# Patient Record
Sex: Female | Born: 1955
Health system: Southern US, Community
[De-identification: ages and names within clinical notes are randomized; demographics above are authoritative.]

## PROBLEM LIST (undated history)

## (undated) DIAGNOSIS — Z780 Asymptomatic menopausal state: Secondary | ICD-10-CM

## (undated) DIAGNOSIS — C50919 Malignant neoplasm of unspecified site of unspecified female breast: Secondary | ICD-10-CM

## (undated) DIAGNOSIS — R011 Cardiac murmur, unspecified: Secondary | ICD-10-CM

## (undated) DIAGNOSIS — K529 Noninfective gastroenteritis and colitis, unspecified: Secondary | ICD-10-CM

## (undated) DIAGNOSIS — G43909 Migraine, unspecified, not intractable, without status migrainosus: Secondary | ICD-10-CM

## (undated) DIAGNOSIS — J302 Other seasonal allergic rhinitis: Secondary | ICD-10-CM

## (undated) DIAGNOSIS — K5010 Crohn's disease of large intestine without complications: Secondary | ICD-10-CM

## (undated) DIAGNOSIS — N3010 Interstitial cystitis (chronic) without hematuria: Secondary | ICD-10-CM

## (undated) DIAGNOSIS — C44310 Basal cell carcinoma of skin of unspecified parts of face: Secondary | ICD-10-CM

## (undated) DIAGNOSIS — F419 Anxiety disorder, unspecified: Secondary | ICD-10-CM

## (undated) DIAGNOSIS — N200 Calculus of kidney: Secondary | ICD-10-CM

## (undated) DIAGNOSIS — E785 Hyperlipidemia, unspecified: Secondary | ICD-10-CM

## (undated) DIAGNOSIS — M1990 Unspecified osteoarthritis, unspecified site: Secondary | ICD-10-CM

## (undated) HISTORY — DX: Other seasonal allergic rhinitis: J30.2

## (undated) HISTORY — PX: BREAST LUMPECTOMY: SHX2

## (undated) HISTORY — DX: Basal cell carcinoma of skin of unspecified parts of face: C44.310

## (undated) HISTORY — DX: Migraine, unspecified, not intractable, without status migrainosus: G43.909

## (undated) HISTORY — DX: Cardiac murmur, unspecified: R01.1

## (undated) HISTORY — DX: Anxiety disorder, unspecified: F41.9

## (undated) HISTORY — DX: Malignant neoplasm of unspecified site of unspecified female breast: C50.919

## (undated) HISTORY — PX: TONSILLECTOMY: SUR1361

## (undated) HISTORY — DX: Calculus of kidney: N20.0

## (undated) HISTORY — DX: Crohn's disease of large intestine without complications: K50.10

## (undated) HISTORY — PX: COLONOSCOPY: SHX174

## (undated) HISTORY — DX: Asymptomatic menopausal state: Z78.0

## (undated) HISTORY — DX: Noninfective gastroenteritis and colitis, unspecified: K52.9

## (undated) HISTORY — DX: Unspecified osteoarthritis, unspecified site: M19.90

## (undated) HISTORY — DX: Interstitial cystitis (chronic) without hematuria: N30.10

## (undated) HISTORY — DX: Hyperlipidemia, unspecified: E78.5

---

## 1999-05-21 ENCOUNTER — Other Ambulatory Visit: Admission: RE | Admit: 1999-05-21 | Discharge: 1999-05-21 | Payer: Self-pay | Admitting: Gynecology

## 2000-04-26 ENCOUNTER — Other Ambulatory Visit: Admission: RE | Admit: 2000-04-26 | Discharge: 2000-04-26 | Payer: Self-pay | Admitting: Gynecology

## 2000-07-12 ENCOUNTER — Other Ambulatory Visit: Admission: RE | Admit: 2000-07-12 | Discharge: 2000-07-12 | Payer: Self-pay | Admitting: Gynecology

## 2001-07-17 ENCOUNTER — Other Ambulatory Visit: Admission: RE | Admit: 2001-07-17 | Discharge: 2001-07-17 | Payer: Self-pay | Admitting: Gynecology

## 2002-07-19 ENCOUNTER — Other Ambulatory Visit: Admission: RE | Admit: 2002-07-19 | Discharge: 2002-07-19 | Payer: Self-pay | Admitting: Obstetrics and Gynecology

## 2003-07-03 ENCOUNTER — Other Ambulatory Visit: Admission: RE | Admit: 2003-07-03 | Discharge: 2003-07-03 | Payer: Self-pay | Admitting: Obstetrics & Gynecology

## 2004-08-03 ENCOUNTER — Other Ambulatory Visit: Admission: RE | Admit: 2004-08-03 | Discharge: 2004-08-03 | Payer: Self-pay | Admitting: Gynecology

## 2005-06-07 ENCOUNTER — Ambulatory Visit: Payer: Self-pay | Admitting: Internal Medicine

## 2005-06-18 ENCOUNTER — Ambulatory Visit: Payer: Self-pay | Admitting: Internal Medicine

## 2005-10-04 ENCOUNTER — Other Ambulatory Visit: Admission: RE | Admit: 2005-10-04 | Discharge: 2005-10-04 | Payer: Self-pay | Admitting: Obstetrics & Gynecology

## 2009-07-22 ENCOUNTER — Encounter: Admission: RE | Admit: 2009-07-22 | Discharge: 2009-07-22 | Payer: Self-pay | Admitting: Family Medicine

## 2009-09-30 ENCOUNTER — Encounter: Admission: RE | Admit: 2009-09-30 | Discharge: 2009-09-30 | Payer: Self-pay | Admitting: Family Medicine

## 2012-04-26 ENCOUNTER — Encounter: Payer: Self-pay | Admitting: Internal Medicine

## 2012-04-26 ENCOUNTER — Ambulatory Visit (INDEPENDENT_AMBULATORY_CARE_PROVIDER_SITE_OTHER): Payer: Self-pay | Admitting: Internal Medicine

## 2012-04-26 VITALS — BP 112/76 | HR 71 | Temp 99.2°F | Ht 61.0 in | Wt 130.2 lb

## 2012-04-26 DIAGNOSIS — N951 Menopausal and female climacteric states: Secondary | ICD-10-CM

## 2012-04-26 DIAGNOSIS — G43909 Migraine, unspecified, not intractable, without status migrainosus: Secondary | ICD-10-CM

## 2012-04-26 DIAGNOSIS — Z780 Asymptomatic menopausal state: Secondary | ICD-10-CM

## 2012-04-26 NOTE — Progress Notes (Signed)
  Subjective:    Patient ID: Jenna Li, female    DOB: 09-25-1956, 56 y.o.   MRN: [id number]  HPI New pt here for first visit.  Primary care [facility].  Jenna Li is here for second opinion regarding HT.  She has been on estradiol/norethindrone for about 5 years and she is concerned about risks.  She initially was placed on HT for frequent vasomotor flushes (drenching) and recently tried to take Her pill qod but flushes returned.  She now take 1/2 pill daily.    No personal of Fh of DVT, PE no breast cancer, no gyn cancers.  Father had NI  Excedrin helps her migraine   No Known Allergies Past Medical History  Diagnosis Date  . Menopause   . Migraine headache    Past Surgical History  Procedure Date  . Cesarean section   . Tonsillectomy    History   Social History  . Marital Status: Married    Spouse Name: N/A    Number of Children: N/A  . Years of Education: N/A   Occupational History  . Not on file.   Social History Main Topics  . Smoking status: Never Smoker   . Smokeless tobacco: Never Used  . Alcohol Use: Not on file  . Drug Use: No  . Sexually Active: Yes    Birth Control/ Protection: Post-menopausal   Other Topics Concern  . Not on file   Social History Narrative  . No narrative on file   Family History  Problem Relation Age of Onset  . Atrial fibrillation Mother   . Heart disease Mother   . Heart disease Father   . Hyperlipidemia Father   . Hyperlipidemia Brother   . Hyperlipidemia Brother    There is no problem list on file for this patient.  Current Outpatient Prescriptions on File Prior to Visit  Medication Sig Dispense Refill  . estradiol-norethindrone (MIMVEY) 1-0.5 MG per tablet Take 0.5 tablets by mouth daily.      . eszopiclone (LUNESTA) 2 MG TABS Take 2 mg by mouth at bedtime. Take immediately before bedtime - takes 1/4 tablet as needed      . loratadine (CLARITIN) 10 MG tablet Take 10 mg by mouth daily as needed.             Review of Systems See HPI    Objective:   Physical Exam Physical Exam  Nursing note and vitals reviewed.  Constitutional: She is oriented to person, place, and time. She appears well-developed and well-nourished.  HENT:  Head: Normocephalic and atraumatic.  Cardiovascular: Normal rate and regular rhythm. Exam reveals no gallop and no friction rub.  No murmur heard.  Pulmonary/Chest: Breath sounds normal. She has no wheezes. She has no rales.  Neurological: She is alert and oriented to person, place, and time.  Skin: Skin is warm and dry.  Psychiatric: She has a normal mood and affect. Her behavior is normal.         Assessment & Plan:  1)  Symptomataic vasomotor flushes.  I gave pt educational handout regarding HT and informed that breast cancer risk is related to duration of use.  Advised there are non-hormone options for hot flushes as 50% of women will flush when coming Off HT.  Also advised it may be easier to come off HY in winter as warm temps increase frequency of flushes.  2)  Migraine  Well controlled    Return prn

## 2012-04-27 ENCOUNTER — Encounter: Payer: Self-pay | Admitting: Internal Medicine

## 2012-04-27 DIAGNOSIS — Z780 Asymptomatic menopausal state: Secondary | ICD-10-CM | POA: Insufficient documentation

## 2012-04-27 DIAGNOSIS — G43909 Migraine, unspecified, not intractable, without status migrainosus: Secondary | ICD-10-CM | POA: Insufficient documentation

## 2012-08-25 ENCOUNTER — Other Ambulatory Visit: Payer: Self-pay | Admitting: Family Medicine

## 2012-08-25 ENCOUNTER — Ambulatory Visit (HOSPITAL_COMMUNITY): Payer: BC Managed Care – PPO

## 2012-08-25 DIAGNOSIS — R102 Pelvic and perineal pain: Secondary | ICD-10-CM

## 2012-08-28 ENCOUNTER — Ambulatory Visit
Admission: RE | Admit: 2012-08-28 | Discharge: 2012-08-28 | Disposition: A | Discharge status: Home / Self Care | Payer: BC Managed Care – PPO | Source: Ambulatory Visit | Attending: Family Medicine | Admitting: Family Medicine

## 2012-08-28 DIAGNOSIS — R102 Pelvic and perineal pain: Secondary | ICD-10-CM

## 2012-08-29 ENCOUNTER — Other Ambulatory Visit: Payer: BC Managed Care – PPO

## 2012-10-12 DIAGNOSIS — R3915 Urgency of urination: Secondary | ICD-10-CM

## 2012-10-12 HISTORY — DX: Urgency of urination: R39.15

## 2012-11-23 ENCOUNTER — Other Ambulatory Visit: Payer: Self-pay | Admitting: Family Medicine

## 2012-11-23 DIAGNOSIS — E8941 Symptomatic postprocedural ovarian failure: Secondary | ICD-10-CM

## 2012-11-28 DIAGNOSIS — N3010 Interstitial cystitis (chronic) without hematuria: Secondary | ICD-10-CM

## 2012-11-28 DIAGNOSIS — R350 Frequency of micturition: Secondary | ICD-10-CM

## 2012-11-28 HISTORY — DX: Interstitial cystitis (chronic) without hematuria: N30.10

## 2012-11-28 HISTORY — DX: Frequency of micturition: R35.0

## 2012-11-29 ENCOUNTER — Other Ambulatory Visit: Payer: BC Managed Care – PPO

## 2012-12-06 ENCOUNTER — Ambulatory Visit
Admission: RE | Admit: 2012-12-06 | Discharge: 2012-12-06 | Disposition: A | Discharge status: Home / Self Care | Payer: BC Managed Care – PPO | Source: Ambulatory Visit | Attending: Family Medicine | Admitting: Family Medicine

## 2012-12-06 DIAGNOSIS — E8941 Symptomatic postprocedural ovarian failure: Secondary | ICD-10-CM

## 2013-02-08 DIAGNOSIS — G4700 Insomnia, unspecified: Secondary | ICD-10-CM

## 2013-02-08 HISTORY — DX: Insomnia, unspecified: G47.00

## 2013-02-09 DIAGNOSIS — H11441 Conjunctival cysts, right eye: Secondary | ICD-10-CM

## 2013-02-09 DIAGNOSIS — H40033 Anatomical narrow angle, bilateral: Secondary | ICD-10-CM

## 2013-02-09 HISTORY — DX: Conjunctival cysts, right eye: H11.441

## 2013-02-09 HISTORY — DX: Anatomical narrow angle, bilateral: H40.033

## 2013-02-22 DIAGNOSIS — IMO0001 Reserved for inherently not codable concepts without codable children: Secondary | ICD-10-CM

## 2013-02-22 HISTORY — DX: Reserved for inherently not codable concepts without codable children: IMO0001

## 2013-03-02 DIAGNOSIS — H2513 Age-related nuclear cataract, bilateral: Secondary | ICD-10-CM

## 2013-03-02 HISTORY — DX: Age-related nuclear cataract, bilateral: H25.13

## 2013-08-28 DIAGNOSIS — D259 Leiomyoma of uterus, unspecified: Secondary | ICD-10-CM

## 2013-08-28 HISTORY — DX: Leiomyoma of uterus, unspecified: D25.9

## 2013-10-12 DIAGNOSIS — N200 Calculus of kidney: Secondary | ICD-10-CM | POA: Insufficient documentation

## 2013-10-12 HISTORY — DX: Calculus of kidney: N20.0

## 2014-08-06 ENCOUNTER — Encounter: Payer: Self-pay | Admitting: Gastroenterology

## 2014-08-19 ENCOUNTER — Ambulatory Visit: Payer: BC Managed Care – PPO | Admitting: Gastroenterology

## 2014-08-28 ENCOUNTER — Ambulatory Visit (INDEPENDENT_AMBULATORY_CARE_PROVIDER_SITE_OTHER): Payer: BC Managed Care – PPO | Admitting: Gastroenterology

## 2014-08-28 ENCOUNTER — Encounter: Payer: Self-pay | Admitting: Gastroenterology

## 2014-08-28 VITALS — BP 100/62 | HR 68 | Ht 61.0 in | Wt 133.6 lb

## 2014-08-28 DIAGNOSIS — R194 Change in bowel habit: Secondary | ICD-10-CM

## 2014-08-28 DIAGNOSIS — K529 Noninfective gastroenteritis and colitis, unspecified: Secondary | ICD-10-CM

## 2014-08-28 DIAGNOSIS — K5010 Crohn's disease of large intestine without complications: Secondary | ICD-10-CM

## 2014-08-28 DIAGNOSIS — R198 Other specified symptoms and signs involving the digestive system and abdomen: Secondary | ICD-10-CM

## 2014-08-28 HISTORY — DX: Noninfective gastroenteritis and colitis, unspecified: K52.9

## 2014-08-28 HISTORY — DX: Crohn's disease of large intestine without complications: K50.10

## 2014-08-28 NOTE — Progress Notes (Signed)
08/28/2014 Jenna Li [id number] 1956-08-15   HISTORY OF PRESENT ILLNESS:  This is a pleasant 58 year old female who is known to Dr. Carlean Purl for previous colonoscopy in 05/2005 at which time the study was normal with only external hemorrhoids noted.  She presents to our office today with complaints of change in bowel habits.  Says that she's tended to have a sensitive stomach all of her life, but previously had more issues with constipation.  Since December of last year, however, she's experienced a change and usually has more frequent looser stools with more urgency at times.  She denies any abdominal pain or blood in her stool.  Says that sometimes her abdomen is just uncomfortable and " just doesn't feel quite right".  These issues do come and go and she actually hasn't been experiencing much problem in the past couple of weeks.  She feels that her symptoms are manageable and more just a nuisance at times, but are not controlling her life to the point that she needs to take medication, but more just wants reassurance that everything else is ok.  She had labs by her PCP in June with normal CBC, CMP, and TSH.    Past Medical History  Diagnosis Date  . Menopause   . Migraine headache   . Interstitial cystitis   . Kidney stones   . Basal cell carcinoma of face    Past Surgical History  Procedure Laterality Date  . Cesarean section    . Tonsillectomy      reports that she has never smoked. She has never used smokeless tobacco. She reports that she drinks about 1.2 ounces of alcohol per week. She reports that she does not use illicit drugs. family history includes Atrial fibrillation in her mother; Heart disease in her father; Hyperlipidemia in her brother, brother, and father; Multiple myeloma in her paternal grandmother. There is no history of Colon cancer, Esophageal cancer, Stomach cancer, Kidney disease, Liver disease, or Diabetes. No Known Allergies    Outpatient Encounter  Prescriptions as of 08/28/2014  Medication Sig  . estradiol-norethindrone (MIMVEY) 1-0.5 MG per tablet Take 0.5 tablets by mouth daily.  . eszopiclone (LUNESTA) 2 MG TABS Take 2 mg by mouth at bedtime. Take immediately before bedtime - takes 1/4 tablet as needed  . fluticasone (FLONASE) 50 MCG/ACT nasal spray Place 1 spray into both nostrils as needed for allergies or rhinitis.  Marland Kitchen gabapentin (NEURONTIN) 100 MG capsule Take 100 mg by mouth daily.  . hydrOXYzine (ATARAX/VISTARIL) 25 MG tablet Take 12.5 mg by mouth daily.  . mirabegron ER (MYRBETRIQ) 50 MG TB24 tablet Take 50 mg by mouth daily.  . pentosan polysulfate (ELMIRON) 100 MG capsule Take 200 mg by mouth daily.  . [DISCONTINUED] loratadine (CLARITIN) 10 MG tablet Take 10 mg by mouth daily as needed.     REVIEW OF SYSTEMS  : All other systems reviewed and negative except where noted in the History of Present Illness.   PHYSICAL EXAM: BP 100/62  Pulse 68  Ht $R'5\' 1"'VB$  (1.549 m)  Wt 133 lb 9.6 oz (60.601 kg)  BMI 25.26 kg/m2 General: Well developed white female in no acute distress Head: Normocephalic and atraumatic Eyes:  Sclerae anicteric, conjunctiva pink. Ears: Normal auditory acuity Lungs: Clear throughout to auscultation Heart: Regular rate and rhythm Abdomen: Soft, non-distended.  Normal bowel sounds.  Non-tender. Rectal:  Deferred.  Will be done at the time of colonoscopy. Musculoskeletal: Symmetrical with no gross deformities  Skin: No lesions on visible extremities Extremities: No edema  Neurological: Alert oriented x 4, grossly non-focal Psychological:  Alert and cooperative. Normal mood and affect  ASSESSMENT AND PLAN: -Change in bowel habits with looser more frequent stools:  She likely has IBS, but due to patient concern will schedule colonoscopy for further evaluation.  She says that if she knows that everything is ok then she can just deal with her symptoms at this point, but would like reassurance.  Will also  request recent lab results from PCP.

## 2014-08-28 NOTE — Progress Notes (Signed)
Agree with Ms. Landess's assessment and plan. Maymuna Detzel E. Jaterrius Ricketson, MD, FACG   

## 2014-08-28 NOTE — Patient Instructions (Signed)
You have been scheduled for a colonoscopy. Please follow written instructions given to you at your visit today.  If you use inhalers (even only as needed), please bring them with you on the day of your procedure. Your physician has requested that you go to www.startemmi.com and enter the access code given to you at your visit today. This web site gives a general overview about your procedure. However, you should still follow specific instructions given to you by our office regarding your preparation for the procedure.  

## 2014-09-03 ENCOUNTER — Encounter: Payer: Self-pay | Admitting: Internal Medicine

## 2014-10-21 ENCOUNTER — Encounter: Payer: Self-pay | Admitting: Gastroenterology

## 2014-10-23 ENCOUNTER — Encounter: Payer: BC Managed Care – PPO | Admitting: Internal Medicine

## 2014-11-13 ENCOUNTER — Encounter: Payer: BC Managed Care – PPO | Admitting: Internal Medicine

## 2014-12-20 DIAGNOSIS — C50919 Malignant neoplasm of unspecified site of unspecified female breast: Secondary | ICD-10-CM

## 2014-12-20 HISTORY — DX: Malignant neoplasm of unspecified site of unspecified female breast: C50.919

## 2014-12-31 ENCOUNTER — Encounter: Payer: BC Managed Care – PPO | Admitting: Internal Medicine

## 2015-01-14 ENCOUNTER — Encounter: Payer: BC Managed Care – PPO | Admitting: Internal Medicine

## 2015-04-15 ENCOUNTER — Encounter: Payer: Self-pay | Admitting: Internal Medicine

## 2015-06-18 ENCOUNTER — Ambulatory Visit (AMBULATORY_SURGERY_CENTER): Payer: Self-pay

## 2015-06-18 VITALS — Ht 62.0 in | Wt 141.4 lb

## 2015-06-18 DIAGNOSIS — Z1211 Encounter for screening for malignant neoplasm of colon: Secondary | ICD-10-CM

## 2015-06-18 NOTE — Progress Notes (Signed)
No allergies to eggs or soy No diet/weight loss meds No home oxygen No past problems with anesthesia  Has email  Emmi instructions given for colonoscopy 

## 2015-06-19 ENCOUNTER — Encounter: Payer: Self-pay | Admitting: Internal Medicine

## 2015-06-22 DIAGNOSIS — C50911 Malignant neoplasm of unspecified site of right female breast: Secondary | ICD-10-CM

## 2015-06-22 HISTORY — DX: Malignant neoplasm of unspecified site of right female breast: C50.911

## 2015-07-09 ENCOUNTER — Encounter: Payer: BC Managed Care – PPO | Admitting: Internal Medicine

## 2015-07-11 ENCOUNTER — Ambulatory Visit (AMBULATORY_SURGERY_CENTER): Payer: BC Managed Care – PPO | Admitting: Internal Medicine

## 2015-07-11 ENCOUNTER — Encounter: Payer: Self-pay | Admitting: Internal Medicine

## 2015-07-11 VITALS — BP 104/68 | HR 78 | Temp 97.8°F | Resp 20 | Ht 62.0 in | Wt 141.0 lb

## 2015-07-11 DIAGNOSIS — K633 Ulcer of intestine: Secondary | ICD-10-CM | POA: Diagnosis not present

## 2015-07-11 DIAGNOSIS — K529 Noninfective gastroenteritis and colitis, unspecified: Secondary | ICD-10-CM | POA: Diagnosis not present

## 2015-07-11 DIAGNOSIS — Z1211 Encounter for screening for malignant neoplasm of colon: Secondary | ICD-10-CM

## 2015-07-11 MED ORDER — SODIUM CHLORIDE 0.9 % IV SOLN: 500.0000 mL | INTRAVENOUS | Status: DC

## 2015-07-11 NOTE — Op Note (Signed)
[address] [place], [id number]   COLONOSCOPY PROCEDURE REPORT  PATIENT: Jenna Li, Jenna Li  MR#: [id number] BIRTHDATE: 1956-04-06 , 70  yrs. old GENDER: female ENDOSCOPIST: Gatha Mayer, MD, [facility] PROCEDURE DATE:  07/11/2015 PROCEDURE:   Colonoscopy, screening and Colonoscopy with biopsy First Screening Colonoscopy - Avg.  risk and is 50 yrs.  old or older - No.  Prior Negative Screening - Now for repeat screening. 10 or more years since last screening  History of Adenoma - Now for follow-up colonoscopy & has been > or = to 3 yrs.  N/A  Polyps removed today? No Recommend repeat exam, <10 yrs? No ASA CLASS:   Class II INDICATIONS:Screening for colonic neoplasia and Colorectal Neoplasm Risk Assessment for this procedure is average risk. MEDICATIONS: Propofol 250 mg IV and Monitored anesthesia care  DESCRIPTION OF PROCEDURE:   After the risks benefits and alternatives of the procedure were thoroughly explained, informed consent was obtained.  The digital rectal exam revealed no abnormalities of the rectum.   The LB [id number]  endoscope was introduced through the anus and advanced to the terminal ileum which was intubated for a short distance. No adverse events experienced.   The quality of the prep was excellent.  (MiraLax was used)  The instrument was then slowly withdrawn as the colon was fully examined. Estimated blood loss is zero unless otherwise noted in this procedure report.    COLON FINDINGS: The examined terminal ileum appeared to be normal. Non-bleeding mucosal ulceration, intermittent across the area examined, was present at the cecum.  Multiple biopsies of the area were performed using cold forceps.   Small external and internal hemorrhoids were found.   The examination was otherwise normal. Retroflexed views revealed internal hemorrhoids and Retroflexed views revealed external hemorrhoids. The time to cecum = 3.3 Withdrawal  time = 11.4   The scope was withdrawn and the procedure completed. COMPLICATIONS: There were no immediate complications.  ENDOSCOPIC IMPRESSION: 1.   The examined terminal ileum appeared to be normal 2.   Non-bleeding mucosal ulceration at the cecum; multiple biopsies of the area were performed using cold forceps ? IBD 3.   Small external and internal hemorrhoids 4.   The examination was otherwise normal  RECOMMENDATIONS: Office will call with the results.  eSigned:  Gatha Mayer, MD, [facility] 07/11/2015 9:37 AM   cc: Dr. Harlan Stains and The Patient

## 2015-07-11 NOTE — Patient Instructions (Addendum)
No signs of polyps or cancer but I saw  small ulcers with inflammatory changes in the cecum or beginning of the colon. I took biopsies and will let you know the results with a phone call. This could be related to the looser bowel movements you were describing in 2015.  You also have inflamed hemorrhoids after the prep. Please try some preparation H cream or suppositories if needed.  I appreciate the opportunity to care for you. Gatha Mayer, MD, FACG  YOU HAD AN ENDOSCOPIC PROCEDURE TODAY AT [facility]:   Refer to the procedure report that was given to you for any specific questions about what was found during the examination.  If the procedure report does not answer your questions, please call your gastroenterologist to clarify.  If you requested that your care partner not be given the details of your procedure findings, then the procedure report has been included in a sealed envelope for you to review at your convenience later.  YOU SHOULD EXPECT: Some feelings of bloating in the abdomen. Passage of more gas than usual.  Walking can help get rid of the air that was put into your GI tract during the procedure and reduce the bloating. If you had a lower endoscopy (such as a colonoscopy or flexible sigmoidoscopy) you may notice spotting of blood in your stool or on the toilet paper. If you underwent a bowel prep for your procedure, you may not have a normal bowel movement for a few days.  Please Note:  You might notice some irritation and congestion in your nose or some drainage.  This is from the oxygen used during your procedure.  There is no need for concern and it should clear up in a day or so.  SYMPTOMS TO REPORT IMMEDIATELY:   Following lower endoscopy (colonoscopy or flexible sigmoidoscopy):  Excessive amounts of blood in the stool  Significant tenderness or worsening of abdominal pains  Swelling of the abdomen that is new, acute  Fever of 100F or  higher   Following upper endoscopy (EGD)  Vomiting of blood or coffee ground material  New chest pain or pain under the shoulder blades  Painful or persistently difficult swallowing  New shortness of breath  Fever of 100F or higher  Black, tarry-looking stools  For urgent or emergent issues, a gastroenterologist can be reached at any hour by calling [phone].   DIET: Your first meal following the procedure should be a small meal and then it is ok to progress to your normal diet. Heavy or fried foods are harder to digest and may make you feel nauseous or bloated.  Likewise, meals heavy in dairy and vegetables can increase bloating.  Drink plenty of fluids but you should avoid alcoholic beverages for 24 hours.  ACTIVITY:  You should plan to take it easy for the rest of today and you should NOT DRIVE or use heavy machinery until tomorrow (because of the sedation medicines used during the test).    FOLLOW UP: Our staff will call the number listed on your records the next business day following your procedure to check on you and address any questions or concerns that you may have regarding the information given to you following your procedure. If we do not reach you, we will leave a message.  However, if you are feeling well and you are not experiencing any problems, there is no need to return our call.  We will assume that you have returned  to your regular daily activities without incident.  If any biopsies were taken you will be contacted by phone or by letter within the next 1-3 weeks.  Please call us at [phone] if you have not heard about the biopsies in 3 weeks.    SIGNATURES/CONFIDENTIALITY: You and/or your care partner have signed paperwork which will be entered into your electronic medical record.  These signatures attest to the fact that that the information above on your After Visit Summary has been reviewed and is understood.  Full responsibility of the confidentiality of  this discharge information lies with you and/or your care-partner.  Hemorrhoid information given.

## 2015-07-11 NOTE — Progress Notes (Signed)
To recovery, report to Scott, RN, VSS 

## 2015-07-11 NOTE — Progress Notes (Signed)
Called to room to assist during endoscopic procedure.  Patient ID and intended procedure confirmed with present staff. Received instructions for my participation in the procedure from the performing physician.  

## 2015-07-14 ENCOUNTER — Telehealth: Payer: Self-pay | Admitting: *Deleted

## 2015-07-14 NOTE — Telephone Encounter (Signed)
  Follow up Call-  Call back number 07/11/2015  Post procedure Call Back phone  # [phone]  Permission to leave phone message Yes     Patient questions:  Do you have a fever, pain , or abdominal swelling? No. Pain Score  0 *  Have you tolerated food without any problems? Yes.    Have you been able to return to your normal activities? Yes.    Do you have any questions about your discharge instructions: Diet   No. Medications  No. Follow up visit  No.  Do you have questions or concerns about your Care? No.  Actions: * If pain score is 4 or above: No action needed, pain <4.  Spoke with spouse, he denies any problems or concerns. Patient is out of town.

## 2015-07-16 ENCOUNTER — Encounter: Payer: Self-pay | Admitting: Internal Medicine

## 2015-07-16 NOTE — Progress Notes (Signed)
Quick Note:  Please let her know that bxs show colitis - I think she has IBD, likely Crohn's colitis  Rx Lialda 1.2 g take 2 each day and see me in 2 months Let her know that if loose stools or diarrhea worsen on Lialda it is a rare side effect and she should stop it and let us know  [facility] - no letter  Colon recall 06/2025 ______

## 2015-07-18 ENCOUNTER — Other Ambulatory Visit: Payer: Self-pay

## 2015-07-18 MED ORDER — MESALAMINE 1.2 G PO TBEC: 2.4000 g | DELAYED_RELEASE_TABLET | Freq: Every day | ORAL | Status: DC

## 2015-09-18 ENCOUNTER — Ambulatory Visit: Payer: BC Managed Care – PPO | Admitting: Internal Medicine

## 2015-12-23 ENCOUNTER — Encounter: Payer: Self-pay | Admitting: Internal Medicine

## 2015-12-23 ENCOUNTER — Ambulatory Visit (INDEPENDENT_AMBULATORY_CARE_PROVIDER_SITE_OTHER): Payer: BC Managed Care – PPO | Admitting: Internal Medicine

## 2015-12-23 VITALS — BP 96/64 | HR 96 | Ht 60.0 in | Wt 141.4 lb

## 2015-12-23 DIAGNOSIS — K529 Noninfective gastroenteritis and colitis, unspecified: Secondary | ICD-10-CM

## 2015-12-23 NOTE — Patient Instructions (Signed)
Please follow up with Dr. Carlean Purl in one year, sooner if needed

## 2015-12-23 NOTE — Progress Notes (Signed)
   Subjective:    Patient ID: Jenna Li, female    DOB: [birth year], 60 y.o.   MRN: [id number] Chief complaint: Follow-up of colitis HPI The patient returns after having had a colonoscopy in the summer, where she had some ulceration, aphthous type, in the right colon and biopsy showed acute colitis. She was having loose stools at the time and I thought she might have Crohn's disease, I recommended Lialda. She was being diagnosed and treated for breast cancer right around that time and felt overwhelmed and did not start the [place]. Since that time she has really been fine. She is not having any diarrhea abdominal pain or rectal bleeding. She had a lumpectomy for her 4 mm breast cancer and is on anastrozole after a short course of radiation therapy. Medications, allergies, past medical history, past surgical history, family history and social history are reviewed and updated in the EMR.  Review of Systems As above    Objective:   Physical Exam BP 96/64 mmHg  Pulse 96  Ht 5' (1.524 m)  Wt 141 lb 6 oz (64.127 kg)  BMI 27.61 kg/m2    Assessment & Plan:   1. Colitis, acute nonspecific at colonoscopy    Colitis - acute nonspecific at colonoscopy I thought she could have Crohn's disease. We reviewed this today and she is doing well, I am not inclined to start therapy. She never started it, and she feels normal at this point. The biopsies did not show chronic colitis so perhaps this is not IBD. It could've been aphthous ulceration related to the colonoscopy prep perhaps. At any rate she feels well there was not severe or much inflammation in the colon anyway and we will observe and I will see her in 1 year routinely sooner if needed.   15 minutes time spent with patient > half in counseling coordination of care

## 2015-12-23 NOTE — Assessment & Plan Note (Signed)
I thought she could have Crohn's disease. We reviewed this today and she is doing well, I am not inclined to start therapy. She never started it, and she feels normal at this point. The biopsies did not show chronic colitis so perhaps this is not IBD. It could've been aphthous ulceration related to the colonoscopy prep perhaps. At any rate she feels well there was not severe or much inflammation in the colon anyway and we will observe and I will see her in 1 year routinely sooner if needed.

## 2016-10-27 ENCOUNTER — Ambulatory Visit
Admission: RE | Admit: 2016-10-27 | Discharge: 2016-10-27 | Disposition: A | Discharge status: Home / Self Care | Payer: BC Managed Care – PPO | Source: Ambulatory Visit | Attending: Family Medicine | Admitting: Family Medicine

## 2016-10-27 ENCOUNTER — Other Ambulatory Visit: Payer: Self-pay | Admitting: Family Medicine

## 2016-10-27 DIAGNOSIS — M5441 Lumbago with sciatica, right side: Secondary | ICD-10-CM

## 2016-12-16 ENCOUNTER — Other Ambulatory Visit: Payer: Self-pay | Admitting: Family Medicine

## 2016-12-16 DIAGNOSIS — M5441 Lumbago with sciatica, right side: Secondary | ICD-10-CM

## 2016-12-28 ENCOUNTER — Ambulatory Visit
Admission: RE | Admit: 2016-12-28 | Discharge: 2016-12-28 | Disposition: A | Discharge status: Home / Self Care | Payer: BC Managed Care – PPO | Source: Ambulatory Visit | Attending: Family Medicine | Admitting: Family Medicine

## 2016-12-28 DIAGNOSIS — M5441 Lumbago with sciatica, right side: Secondary | ICD-10-CM

## 2017-07-06 ENCOUNTER — Ambulatory Visit
Admission: RE | Admit: 2017-07-06 | Discharge: 2017-07-06 | Disposition: A | Discharge status: Home / Self Care | Payer: BC Managed Care – PPO | Source: Ambulatory Visit | Attending: Family Medicine | Admitting: Family Medicine

## 2017-07-06 ENCOUNTER — Other Ambulatory Visit: Payer: Self-pay | Admitting: Family Medicine

## 2017-07-06 DIAGNOSIS — R079 Chest pain, unspecified: Secondary | ICD-10-CM

## 2017-09-22 DIAGNOSIS — M810 Age-related osteoporosis without current pathological fracture: Secondary | ICD-10-CM

## 2017-09-22 HISTORY — DX: Age-related osteoporosis without current pathological fracture: M81.0

## 2018-11-02 ENCOUNTER — Encounter: Payer: Self-pay | Admitting: Cardiology

## 2018-11-02 ENCOUNTER — Telehealth: Payer: Self-pay

## 2018-11-02 NOTE — Telephone Encounter (Signed)
SENT REFERRAL TO SCHEDULING AND FILED NOTES 

## 2018-11-28 NOTE — Progress Notes (Signed)
Cardiology Office Note:    Date:  11/29/2018   ID:  Jenna Li, [place] November 16, 1956, MRN [id number]  PCP:  Harlan Stains, MD  Cardiologist:  Shirlee More, MD   Referring MD: Caren Macadam, MD  ASSESSMENT:    1. Tachycardia   2. Encounter for cardiac risk counseling   3. Palpitation   4. Other specified personal risk factors, not elsewhere classified    PLAN:    In order of problems listed above:  1. Her tachycardia was sinus not a sustained atrial arrhythmia and is quite improved after taking a beta-blocker for rapid heart rate and migraine we discussed withdrawal but she would like to stay on it because of the migraine prophylaxis and I think that is alleviated her symptoms.  At this time I do not think there is value in doing cardiac imaging or ambulatory heart rhythm monitor was unless she was having recurrent symptoms.  I did give her information about buying attachment for her iPhone and I watched record her heart rhythm.  At this time her palpitation is not severe sustained if recurrent we have avenues to do heart rhythm surveillance. 2. Her second question is her general cardiovascular risk is a survivor of breast Li and whether she should take a statin.  She is not particularly at high risk without stroke heart attack diabetes or any LDL greater than 190 but she is at increased risk with her breast Li in order to come to a decision to undergo cardiac CT score of 0 there is absolutely no benefit of taking a statin and if she is intermediate or high risk would benefit from long-term statin therapy and will use this to make a decision.  She is comfortable with that approach and strongly encouraged to continue healthy lifestyle and exercise  Next appointment as needed   Medication Adjustments/Labs and Tests Ordered: Current medicines are reviewed at length with the patient today.  Concerns regarding medicines are outlined above.  Orders Placed This Encounter  Procedures   . CT CARDIAC SCORING   No orders of the defined types were placed in this encounter.    Chief Complaint  Patient presents with  . Palpitations    History of Present Illness:    Jenna Li is a 62 y.o. female withStage 1 Right breast Li  who is being seen today for the evaluation of palpitation at the request of Caren Macadam, MD. She is very health conscious because of breast Li with surgery right breast radiation and hormone receptor blockade.  She also is taking medication for urinary incontinence that can cause accelerated heart rate and blood pressure.  She relates she bought an apple watch several months ago notices that her heart rates run in size 90-100 at rest and was concerned.  She has a history of migraine was placed on propranolol not a really nice response with a resting heart rate decreasing and a migraine headache resolving.  She has a lifelong history of episodes where she feels her heart is rapid but will terminate with deep breathing and she has had none recently.  She has no known heart disease but is concerned about her cardiovascular risk and questions whether she needs to take a statin.  No chest pain shortness of breath exercise intolerance syncope or TIA no history of congenital or rheumatic heart disease.  She is having no side effects from her beta-blocker  Past Medical History:  Diagnosis Date  . Basal cell carcinoma of face   .  Breast Li ([place])   . Colitis - acute nonspecific at colonoscopy 08/28/2014  . Crohn's colitis - suspected 08/28/2014  . Heart murmur   . Interstitial cystitis   . Kidney stones   . Menopause   . Migraine headache   . Seasonal allergies     Past Surgical History:  Procedure Laterality Date  . BREAST LUMPECTOMY    . CESAREAN SECTION    . COLONOSCOPY    . TONSILLECTOMY      Current Medications: Current Meds  Medication Sig  . anastrozole (ARIMIDEX) 1 MG tablet Take 1 tablet by mouth daily.  . eszopiclone  (LUNESTA) 2 MG TABS Take 2 mg by mouth at bedtime. Take immediately before bedtime - takes 1/2 tablet as needed  . fluticasone (FLONASE) 50 MCG/ACT nasal spray Place 1 spray into both nostrils as needed for allergies or rhinitis.  Marland Kitchen mirabegron ER (MYRBETRIQ) 50 MG TB24 tablet Take 50 mg by mouth daily.  . pentosan polysulfate (ELMIRON) 100 MG capsule Take 200 mg by mouth daily.  . propranolol ER (INDERAL LA) 60 MG 24 hr capsule Take 60 mg by mouth daily.  . SUMAtriptan (IMITREX) 100 MG tablet TAKE 1 TAB AT ONSET OF HEADACHE, REPEAT IN 2 HOURS X 1 IF NEEDED ORALLY     Allergies:   Patient has no known allergies.   Social History   Socioeconomic History  . Marital status: Married    Spouse name: Not on file  . Number of children: Not on file  . Years of education: Not on file  . Highest education level: Not on file  Occupational History  . Not on file  Social Needs  . Financial resource strain: Not on file  . Food insecurity:    Worry: Not on file    Inability: Not on file  . Transportation needs:    Medical: Not on file    Non-medical: Not on file  Tobacco Use  . Smoking status: Never Smoker  . Smokeless tobacco: Never Used  Substance and Sexual Activity  . Alcohol use: Yes    Alcohol/week: 1.0 standard drinks    Types: 1 Glasses of wine per week    Comment: 1 glass of wine on weekends  . Drug use: No  . Sexual activity: Yes    Birth control/protection: Post-menopausal  Lifestyle  . Physical activity:    Days per week: Not on file    Minutes per session: Not on file  . Stress: Not on file  Relationships  . Social connections:    Talks on phone: Not on file    Gets together: Not on file    Attends religious service: Not on file    Active member of club or organization: Not on file    Attends meetings of clubs or organizations: Not on file    Relationship status: Not on file  Other Topics Concern  . Not on file  Social History Narrative  . Not on file     Family  History: The patient's family history includes Atrial fibrillation in her mother; Heart disease in her father; Hyperlipidemia in her brother, brother, and father; Multiple myeloma in her paternal grandmother. There is no history of Colon Li, Esophageal Li, Stomach Li, Kidney disease, Liver disease, or Diabetes.  ROS:   Review of Systems  Constitution: Positive for malaise/fatigue.  HENT: Negative.   Eyes: Negative.   Cardiovascular: Positive for palpitations.  Respiratory: Negative.   Endocrine: Negative.   Hematologic/Lymphatic: Negative.  Skin: Negative.   Musculoskeletal: Negative.   Gastrointestinal: Negative.   Genitourinary: Negative.   Neurological: Negative.   Psychiatric/Behavioral: Negative.   Allergic/Immunologic: Negative.    Please see the history of present illness.     All other systems reviewed and are negative.  EKGs/Labs/Other Studies Reviewed:    The following studies were reviewed today:   EKG:  EKG is  ordered today.  The ekg ordered today demonstrates sinus rhythm 72 bpm  Recent Labs:   10/26/18 CMP normal K 4.1 CBC normal No results found for requested labs within last 8760 hours.  Recent Lipid Panel No results found for: CHOL, TRIG, HDL, CHOLHDL, VLDL, LDLCALC, LDLDIRECT  Physical Exam:    VS:  BP 110/80 (BP Location: Left Arm, Patient Position: Sitting, Cuff Size: Normal)   Pulse 72   Ht _0  (1.575 m)   Wt 141 lb 6.4 oz (64.1 kg)   SpO2 96%   BMI 25.86 kg/m     Wt Readings from Last 3 Encounters:  11/29/18 141 lb 6.4 oz (64.1 kg)  12/23/15 141 lb 6 oz (64.1 kg)  07/11/15 141 lb (64 kg)     GEN:  Well nourished, well developed in no acute distress HEENT: Normal NECK: No JVD; No carotid bruits LYMPHATICS: No lymphadenopathy CARDIAC: RRR, no murmurs, rubs, gallops RESPIRATORY:  Clear to auscultation without rales, wheezing or rhonchi  ABDOMEN: Soft, non-tender, non-distended MUSCULOSKELETAL:  No edema; No deformity  SKIN:  Warm and dry NEUROLOGIC:  Alert and oriented x 3 PSYCHIATRIC:  Normal affect     Signed, Shirlee More, MD  11/29/2018 3:54 PM    [facility]

## 2018-11-29 ENCOUNTER — Ambulatory Visit: Payer: BC Managed Care – PPO | Admitting: Cardiology

## 2018-11-29 ENCOUNTER — Encounter: Payer: Self-pay | Admitting: Cardiology

## 2018-11-29 VITALS — BP 110/80 | HR 72 | Ht 62.0 in | Wt 141.4 lb

## 2018-11-29 DIAGNOSIS — R000 Tachycardia, unspecified: Secondary | ICD-10-CM

## 2018-11-29 DIAGNOSIS — Z7189 Other specified counseling: Secondary | ICD-10-CM

## 2018-11-29 DIAGNOSIS — R002 Palpitations: Secondary | ICD-10-CM

## 2018-11-29 DIAGNOSIS — Z9189 Other specified personal risk factors, not elsewhere classified: Secondary | ICD-10-CM

## 2018-11-29 HISTORY — DX: Tachycardia, unspecified: R00.0

## 2018-11-29 HISTORY — DX: Palpitations: R00.2

## 2018-11-29 HISTORY — DX: Other specified counseling: Z71.89

## 2018-11-29 NOTE — Patient Instructions (Addendum)
Medication Instructions:  Your physician recommends that you continue on your current medications as directed. Please refer to the Current Medication list given to you today.  If you need a refill on your cardiac medications before your next appointment, please call your pharmacy.   Lab work: None  If you have labs (blood work) drawn today and your tests are completely normal, you will receive your results only by: Marland Kitchen MyChart Message (if you have MyChart) OR . A paper copy in the mail If you have any lab test that is abnormal or we need to change your treatment, we will call you to review the results.  Testing/Procedures: You will be contact to schedule your cardiac calcium scoring at the [facility] in [place].   Follow-Up: At [facility], you and your health needs are our priority.  As part of our continuing mission to provide you with exceptional heart care, we have created designated Provider Care Teams.  These Care Teams include your primary Cardiologist (physician) and Advanced Practice Providers (APPs -  Physician Assistants and Nurse Practitioners) who all work together to provide you with the care you need, when you need it. You will need a follow up appointment as needed if symptoms worsen or fail to improve.       KardiaMobile [email or web address]        FDA-cleared, clinical grade mobile EKG monitor: Jenna Li is the most clinically-validated mobile EKG used by the world's leading cardiac care medical professionals With Basic service, know instantly if your heart rhythm is normal or if atrial fibrillation is detected, and email the last single EKG recording to yourself or your doctor Premium service, available for purchase through the Kardia app for $9.99 per month or $99 per year, includes unlimited history and storage of your EKG recordings, a monthly EKG summary report to share with your doctor, along with the ability to track your  blood pressure, activity and weight Includes one KardiaMobile phone clip FREE SHIPPING: Standard delivery 1-3 business days. Orders placed by 11:00am PST will ship that afternoon. Otherwise, will ship next business day. All orders ship via [employer] from [place], [place], the new wearable EKG by [employer]. Vladimir Faster replaces your original Apple Watch band. The first of its kind, FDA-cleared KardiaBand provides accurate and instant analysis for detecting atrial fibrillation (AF) and normal sinus rhythm in an EKG. Simply place your thumb on the integrated KardiaBand sensor to take a medical-grade EKG in just 30 seconds. Results appear instantly on your Apple Watch. Vladimir Faster is available today for just $199. KardiaBand features are designed exclusively for use with [employer]  - $99 year. The [employer] for [employer] includes [employer]'s revolutionary SmartRhythm monitoring feature. SmartRhythm monitoring uses an intelligent neural network that runs directly on the [employer], constantly acquiring data from the watch's heart rate sensor and its accelerometer. SmartRhythm compares your heart rate to what it expects from your minute-by-minute level of activity. When the network sees a pattern of heart rate and activity that it does not expect, it notifies you to take an EKG. With [employer], peace of mind is just an EKG away.  .        Coronary Calcium Scan A coronary calcium scan is an imaging test used to look for deposits of calcium and other fatty materials (plaques) in the inner lining of the blood vessels of the heart (coronary arteries). These deposits of calcium and plaques  can partly clog and narrow the coronary arteries without producing any symptoms or warning signs. This puts a person at risk for a heart attack. This test can detect these deposits before symptoms develop. Tell a health care provider about:  Any allergies you  have.  All medicines you are taking, including vitamins, herbs, eye drops, creams, and over-the-counter medicines.  Any problems you or family members have had with anesthetic medicines.  Any blood disorders you have.  Any surgeries you have had.  Any medical conditions you have.  Whether you are pregnant or may be pregnant. What are the risks? Generally, this is a safe procedure. However, problems may occur, including:  Harm to a pregnant woman and her unborn baby. This test involves the use of radiation. Radiation exposure can be dangerous to a pregnant woman and her unborn baby. If you are pregnant, you generally should not have this procedure done.  Slight increase in the risk of cancer. This is because of the radiation involved in the test.  What happens before the procedure? No preparation is needed for this procedure. What happens during the procedure?  You will undress and remove any jewelry around your neck or chest.  You will put on a hospital gown.  Sticky electrodes will be placed on your chest. The electrodes will be connected to an electrocardiogram (ECG) machine to record a tracing of the electrical activity of your heart.  A CT scanner will take pictures of your heart. During this time, you will be asked to lie still and hold your breath for 2-3 seconds while a picture of your heart is being taken. The procedure may vary among health care providers and hospitals. What happens after the procedure?  You can get dressed.  You can return to your normal activities.  It is up to you to get the results of your test. Ask your health care provider, or the department that is doing the test, when your results will be ready. Summary  A coronary calcium scan is an imaging test used to look for deposits of calcium and other fatty materials (plaques) in the inner lining of the blood vessels of the heart (coronary arteries).  Generally, this is a safe procedure. Tell your  health care provider if you are pregnant or may be pregnant.  No preparation is needed for this procedure.  A CT scanner will take pictures of your heart.  You can return to your normal activities after the scan is done. This information is not intended to replace advice given to you by your health care provider. Make sure you discuss any questions you have with your health care provider. Document Released: 06/03/2008 Document Revised: 10/25/2016 Document Reviewed: 10/25/2016 Elsevier Interactive Patient Education  2017 [employer].

## 2018-11-29 NOTE — Addendum Note (Signed)
Addended by: Stevan Born on: 11/29/2018 04:38 PM   Modules accepted: Orders

## 2018-12-22 ENCOUNTER — Ambulatory Visit (INDEPENDENT_AMBULATORY_CARE_PROVIDER_SITE_OTHER)
Admission: RE | Admit: 2018-12-22 | Discharge: 2018-12-22 | Disposition: A | Discharge status: Home / Self Care | Payer: Self-pay | Source: Ambulatory Visit | Attending: Cardiology | Admitting: Cardiology

## 2018-12-22 DIAGNOSIS — Z9189 Other specified personal risk factors, not elsewhere classified: Secondary | ICD-10-CM

## 2018-12-26 ENCOUNTER — Telehealth: Payer: Self-pay

## 2018-12-26 MED ORDER — PRAVASTATIN SODIUM 20 MG PO TABS: 20.0000 mg | ORAL_TABLET | Freq: Every evening | ORAL | 5 refills | Status: DC

## 2018-12-26 NOTE — Telephone Encounter (Signed)
-----   Message from Richardo Priest, MD sent at 12/25/2018  1:01 PM EST ----- Normal or stable result  Her score is low risk 0 200, quite low but not 0 and I would still have her start taking a low dose of a low intensity statin pravastatin 20 mg daily dispense 30 refill 3 if she is agreeable 1 p.o. daily

## 2018-12-26 NOTE — Telephone Encounter (Signed)
Patient notified of results per Dr Bettina Gavia.  Patient advised that Dr Bettina Gavia would like her to start pravastatin 20mg  one tablet daily.  Patient agreed to plan and will also discuss with primary care physician later this week.   Rx sent to pharmacy as requested.  Recall added for 6 months per patients request.

## 2019-05-20 DIAGNOSIS — N951 Menopausal and female climacteric states: Secondary | ICD-10-CM | POA: Insufficient documentation

## 2019-05-20 HISTORY — DX: Menopausal and female climacteric states: N95.1

## 2019-06-17 ENCOUNTER — Other Ambulatory Visit: Payer: Self-pay | Admitting: Cardiology

## 2019-09-09 IMAGING — CT CT HEART SCORING
2 series · 16 of 20 positions shown, 18 images · non-contrast
Comparison: None.

Addendum:
EXAM:
OVER-READ INTERPRETATION  CT CHEST

The following report is an over-read performed by radiologist Dr.
Jeannesse Seguna Flask [REDACTED] on 12/22/2018. This
over-read does not include interpretation of cardiac or coronary
anatomy or pathology. The coronary calcium score interpretation by
the cardiologist is attached.
CLINICAL DATA: 62F with prior breast cancer for risk stratification
Coronary Calcium Score
TECHNIQUE: The patient was scanned on a Siemens Force scanner. Axial
non-contrast 3 mm slices were carried out through the heart. The
data set was analyzed on a dedicated work station and scored using
the Agatson method.

[Series 3: casc 3.0 i36f 2 bestdiast 70 % · axial · 0.30mm/px · z∈[+1294,+1387]mm · 8 of 41 slices shown, 10 images]
[im 5/41  vessel]
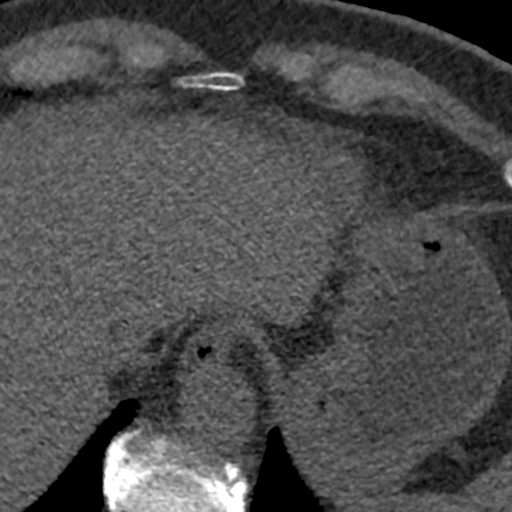
[im 5/41  lung]
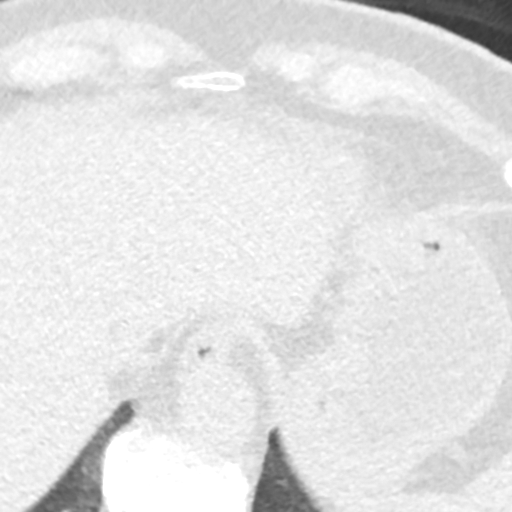
[im 9/41  vessel]
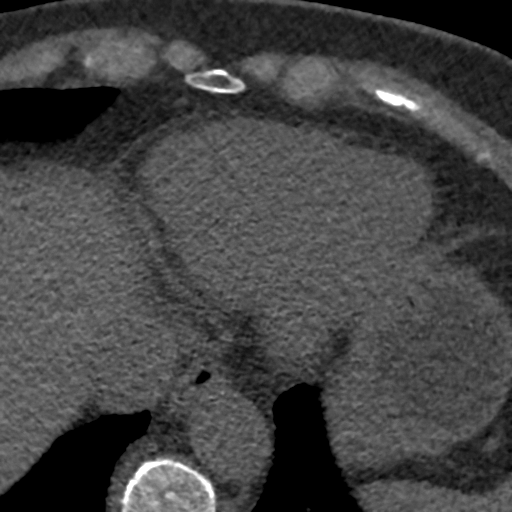
[im 14/41  vessel]
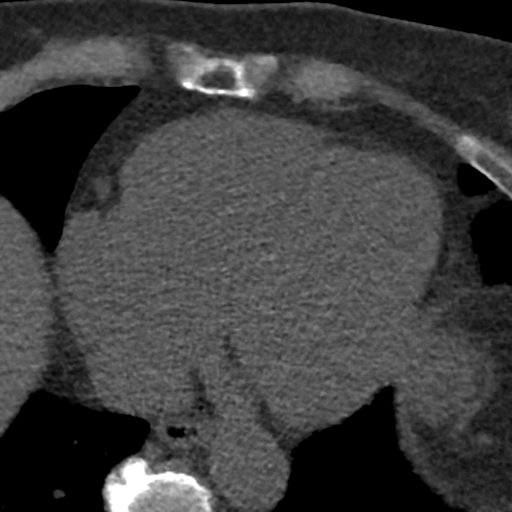
[im 18/41  vessel]
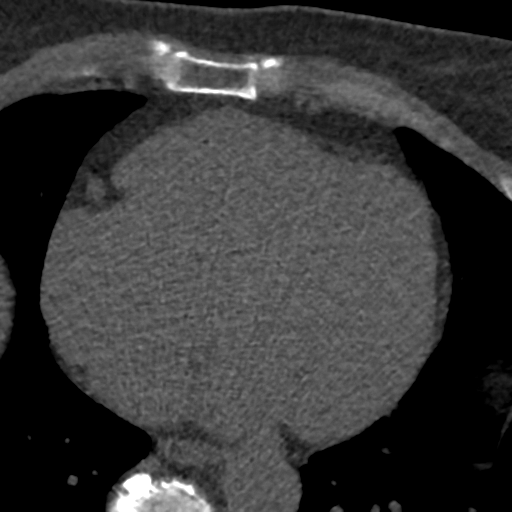
[im 23/41  vessel]
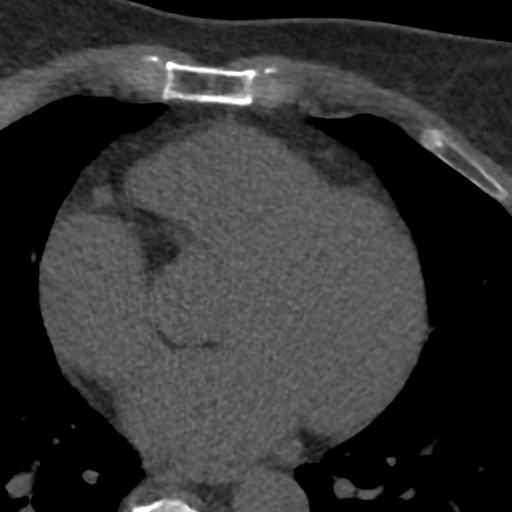
[im 23/41  lung]
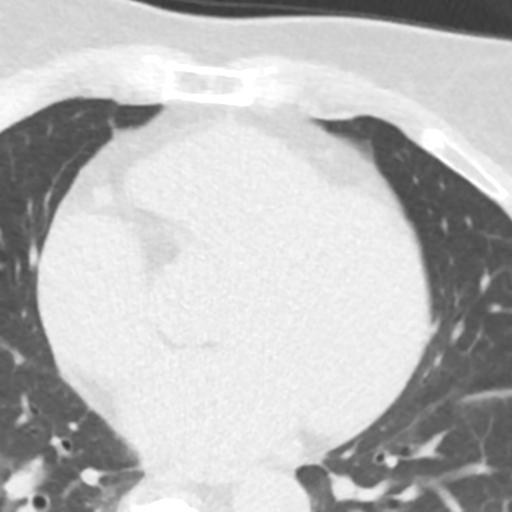
[im 27/41  vessel]
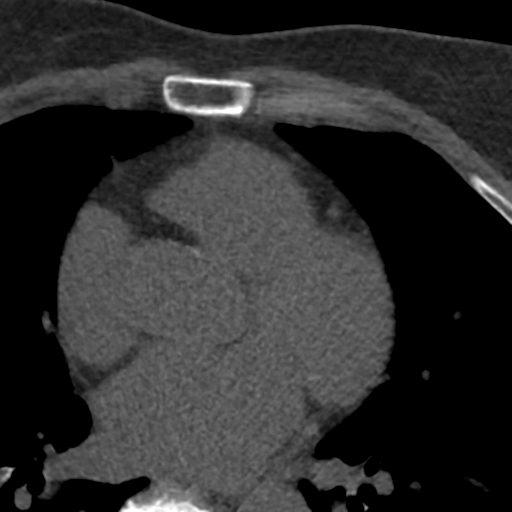
[im 32/41  vessel]
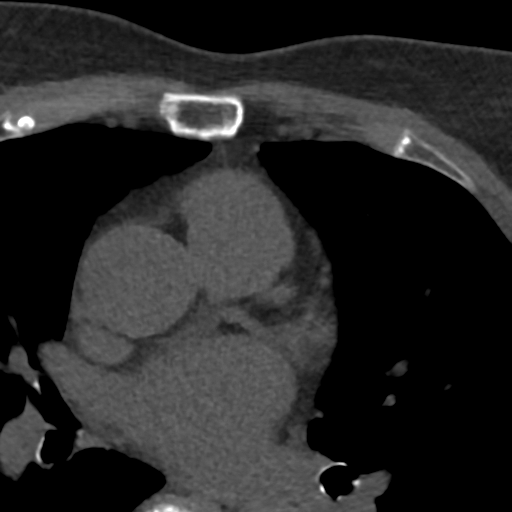
[im 36/41  vessel]
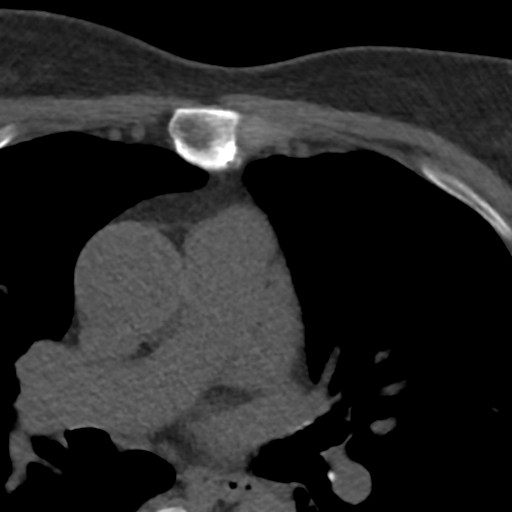

[Series 5: lung st 70 % · axial · 0.58mm/px · z∈[+1294,+1387]mm · 8 of 41 slices shown]
[im 5/41  lung]
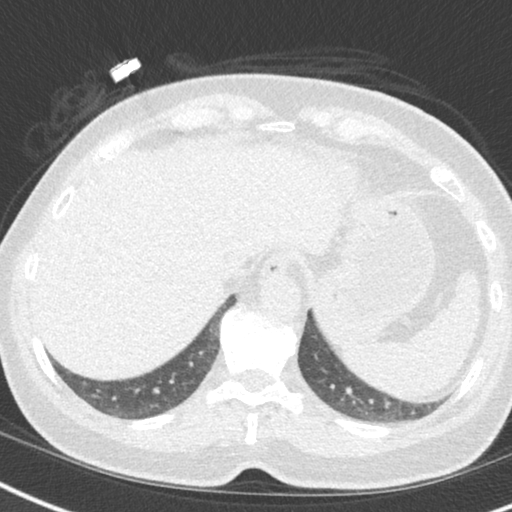
[im 9/41  lung]
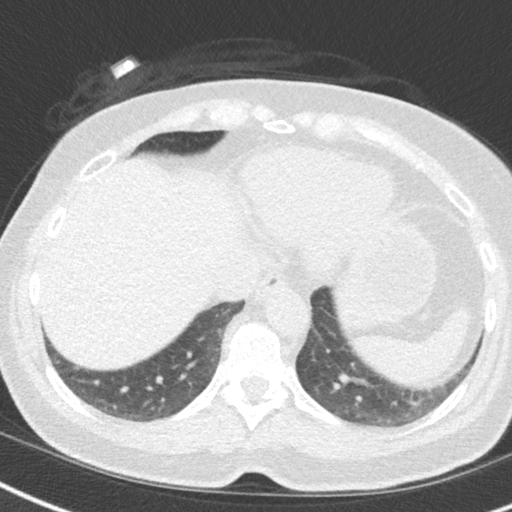
[im 14/41  lung]
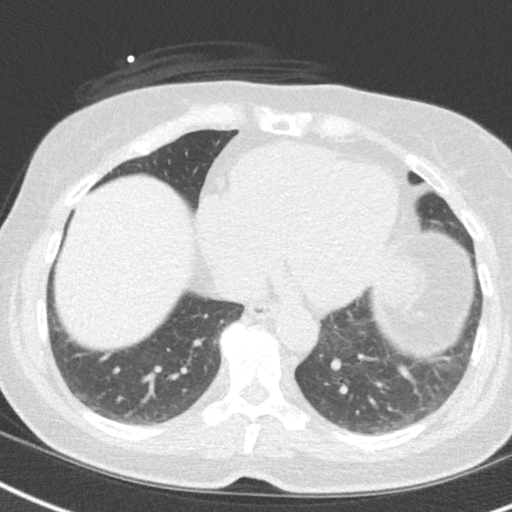
[im 18/41  lung]
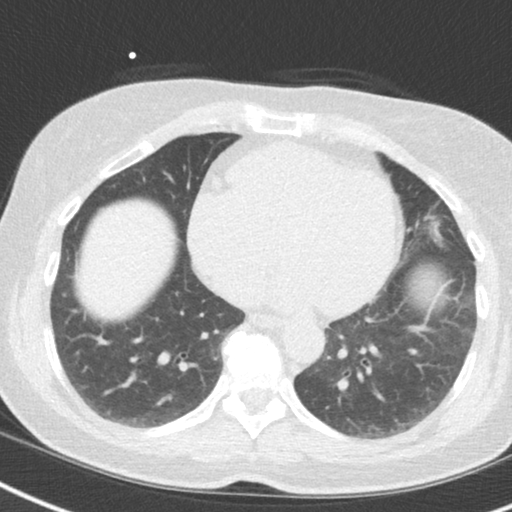
[im 23/41  lung]
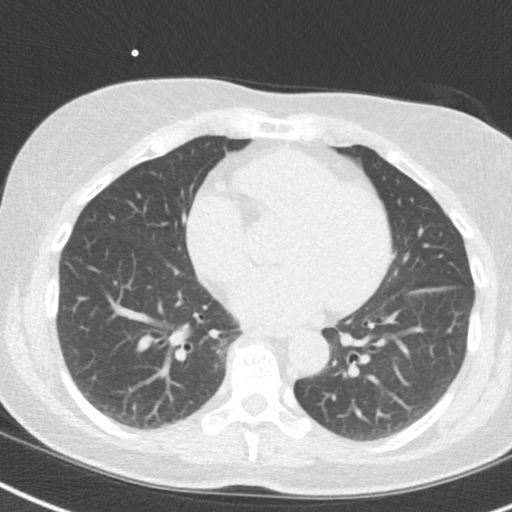
[im 27/41  lung]
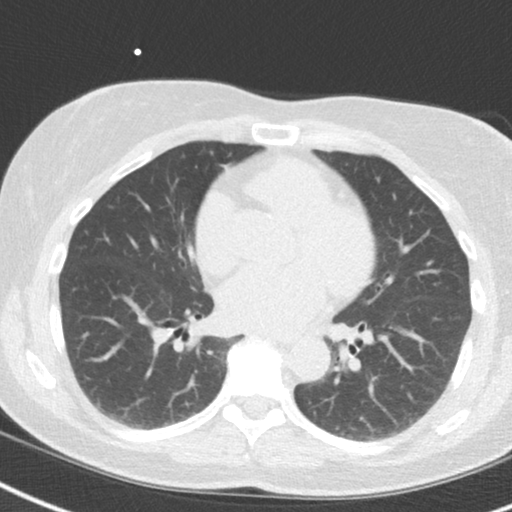
[im 32/41  lung]
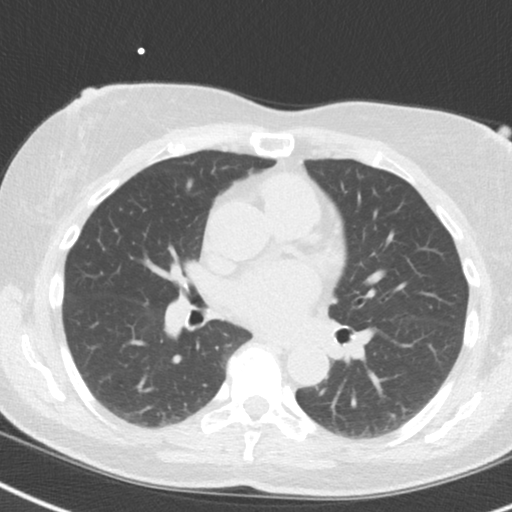
[im 36/41  lung]
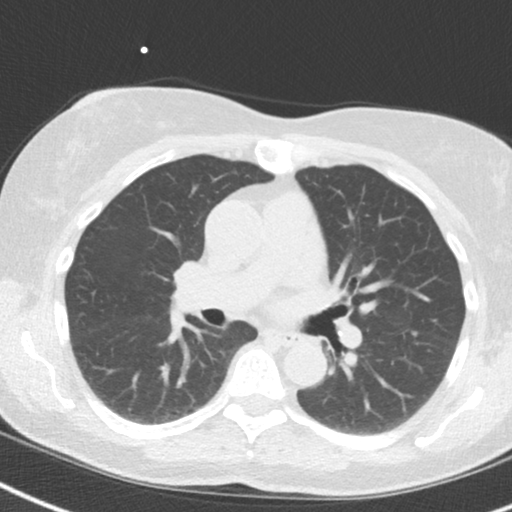

[16 of 20 positions shown; findings below may reference images not displayed]

FINDINGS: Within the visualized portions of the thorax there are no suspicious
appearing pulmonary nodules or masses, there is no acute
consolidative airspace disease, no pleural effusions, no
pneumothorax and no lymphadenopathy. Visualized portions of the
upper abdomen are unremarkable. There are no aggressive appearing
lytic or blastic lesions noted in the visualized portions of the
skeleton.
IMPRESSION: No significant incidental noncardiac findings are noted.
FINDINGS: Non-cardiac: See separate report from [REDACTED].

Ascending Aorta: Normal size.  3.3 cm.  No calcification.

Pericardium: Normal

Coronary arteries:

Mild calcification noted in the distal RCA.
IMPRESSION: Coronary calcium score of 1.67 Agatson units. This was 58th
percentile for age and sex matched control.

*** End of Addendum ***

## 2019-12-13 ENCOUNTER — Other Ambulatory Visit: Payer: Self-pay | Admitting: Cardiology

## 2020-01-11 ENCOUNTER — Other Ambulatory Visit: Payer: Self-pay | Admitting: Cardiology

## 2020-02-01 ENCOUNTER — Encounter: Payer: Self-pay | Admitting: Cardiology

## 2020-02-01 ENCOUNTER — Ambulatory Visit (INDEPENDENT_AMBULATORY_CARE_PROVIDER_SITE_OTHER): Payer: BC Managed Care – PPO | Admitting: Cardiology

## 2020-02-01 ENCOUNTER — Other Ambulatory Visit: Payer: Self-pay

## 2020-02-01 VITALS — BP 102/66 | HR 80 | Temp 96.5°F | Ht 62.0 in | Wt 144.0 lb

## 2020-02-01 DIAGNOSIS — R931 Abnormal findings on diagnostic imaging of heart and coronary circulation: Secondary | ICD-10-CM | POA: Insufficient documentation

## 2020-02-01 DIAGNOSIS — E785 Hyperlipidemia, unspecified: Secondary | ICD-10-CM

## 2020-02-01 HISTORY — DX: Abnormal findings on diagnostic imaging of heart and coronary circulation: R93.1

## 2020-02-01 MED ORDER — ROSUVASTATIN CALCIUM 10 MG PO TABS: 10.0000 mg | ORAL_TABLET | Freq: Every day | ORAL | 3 refills | Status: DC

## 2020-02-01 MED ORDER — ASPIRIN EC 81 MG PO TBEC: 81.0000 mg | DELAYED_RELEASE_TABLET | Freq: Every day | ORAL | 3 refills | Status: DC

## 2020-02-01 NOTE — Progress Notes (Signed)
Cardiology Office Note:    Date:  02/01/2020   ID:  Jenna Li, Jenna Li 1956/04/09, MRN [id number]  PCP:  Jenna Stains, MD  Cardiologist:  Jenna More, MD    Referring MD: Jenna Stains, MD    ASSESSMENT:    1. Dyslipidemia   2. Elevated coronary artery calcium score    PLAN:    In order of problems listed above:  1. She is frustrated that her lipids are not at target I explained the limitations of a low intensity statin.  She wants to do everything she can reduce her cardiovascular risk and despite a relatively low coronary calcium score I think she would benefit from lipid-lowering therapy and will transition to rosuvastatin follow-up lipid profile in 4 weeks if LDL remains greater than 100 add Zetia.  Also screen her for lipoprotein a.  For general cardiovascular prevention she will start aspirin 81 mg daily.  I will see back in the office in 6 weeks     Medication Adjustments/Labs and Tests Ordered: Current medicines are reviewed at length with the patient today.  Concerns regarding medicines are outlined above.  Orders Placed This Encounter  Procedures  . Lipid panel  . Lipoprotein A (LPA)   Meds ordered this encounter  Medications  . rosuvastatin (CRESTOR) 10 MG tablet    Sig: Take 1 tablet (10 mg total) by mouth daily.    Dispense:  90 tablet    Refill:  3  . aspirin EC 81 MG tablet    Sig: Take 1 tablet (81 mg total) by mouth daily.    Dispense:  90 tablet    Refill:  3    No chief complaint on file.   History of Present Illness:     Jenna Li is a 64 y.o. female with a hx of withStage 1 Right breast cancer and rapid HR last seen 11/29/2018.She is very health conscious because of breast cancer with surgery right breast radiation and hormone receptor blockade She relates she bought an apple watch and notices that her heart rates run in size 90-100 at rest and was concerned.   Compliance with diet, lifestyle and medications: Yes  Cardiac CT  12/23/2019: Dependently reviewed FINDINGS: Non-cardiac: See separate report from [facility]. Ascending Aorta: Normal size.  3.3 cm.  No calcification. Pericardium: Normal Coronary arteries: Mild calcification noted in the distal RCA.  IMPRESSION: Coronary calcium score of 1.67 Agatson units. This was 58th percentile for age and sex matched control Past Medical History:  Diagnosis Date  . Basal cell carcinoma of face   . Breast cancer ([place])   . Colitis - acute nonspecific at colonoscopy 08/28/2014  . Crohn's colitis - suspected 08/28/2014  . Heart murmur   . Interstitial cystitis   . Kidney stones   . Menopause   . Migraine headache   . Seasonal allergies     Past Surgical History:  Procedure Laterality Date  . BREAST LUMPECTOMY    . CESAREAN SECTION    . COLONOSCOPY    . TONSILLECTOMY      Current Medications: Current Meds  Medication Sig  . amoxicillin (AMOXIL) 500 MG capsule SMARTSIG:2 Capsule(s) By Mouth Every 12 Hours  . anastrozole (ARIMIDEX) 1 MG tablet Take 1 tablet by mouth daily.  Marland Kitchen escitalopram (LEXAPRO) 10 MG tablet Take 10 mg by mouth daily.  . eszopiclone (LUNESTA) 2 MG TABS Take 2 mg by mouth at bedtime. Take immediately before bedtime - takes 1/2 tablet as needed  .  exemestane (AROMASIN) 25 MG tablet Take 25 mg by mouth daily.  . mirabegron ER (MYRBETRIQ) 50 MG TB24 tablet Take 50 mg by mouth daily.  . nitrofurantoin (MACRODANTIN) 50 MG capsule Take 50 mg by mouth at bedtime.  . propranolol ER (INDERAL LA) 60 MG 24 hr capsule Take 60 mg by mouth daily.  . SUMAtriptan (IMITREX) 100 MG tablet TAKE 1 TAB AT ONSET OF HEADACHE, REPEAT IN 2 HOURS X 1 IF NEEDED ORALLY  . tretinoin (RETIN-A) 0.05 % cream USE 1 APPLICATION TOPICALLY QHS  . [DISCONTINUED] pravastatin (PRAVACHOL) 20 MG tablet TAKE 1 TABLET BY MOUTH EVERY DAY IN THE EVENING     Allergies:   Patient has no known allergies.   Social History   Socioeconomic History  . Marital status:  Married    Spouse name: Not on file  . Number of children: Not on file  . Years of education: Not on file  . Highest education level: Not on file  Occupational History  . Not on file  Tobacco Use  . Smoking status: Never Smoker  . Smokeless tobacco: Never Used  Substance and Sexual Activity  . Alcohol use: Yes    Alcohol/week: 1.0 standard drinks    Types: 1 Glasses of wine per week    Comment: 1 glass of wine on weekends  . Drug use: No  . Sexual activity: Yes    Birth control/protection: Post-menopausal  Other Topics Concern  . Not on file  Social History Narrative  . Not on file   Social Determinants of Health   Financial Resource Strain:   . Difficulty of Paying Living Expenses: Not on file  Food Insecurity:   . Worried About Charity fundraiser in the Last Year: Not on file  . Ran Out of Food in the Last Year: Not on file  Transportation Needs:   . Lack of Transportation (Medical): Not on file  . Lack of Transportation (Non-Medical): Not on file  Physical Activity:   . Days of Exercise per Week: Not on file  . Minutes of Exercise per Session: Not on file  Stress:   . Feeling of Stress : Not on file  Social Connections:   . Frequency of Communication with Friends and Family: Not on file  . Frequency of Social Gatherings with Friends and Family: Not on file  . Attends Religious Services: Not on file  . Active Member of Clubs or Organizations: Not on file  . Attends Archivist Meetings: Not on file  . Marital Status: Not on file     Family History: The patient's family history includes Atrial fibrillation in her mother; Heart disease in her father; Hyperlipidemia in her brother, brother, and father; Multiple myeloma in her paternal grandmother. There is no history of Colon cancer, Esophageal cancer, Stomach cancer, Kidney disease, Liver disease, or Diabetes. ROS:   Please see the history of present illness.    All other systems reviewed and are negative.   EKGs/Labs/Other Studies Reviewed:    The following studies were reviewed today:    Recent Labs: Cholesterol 206 HDL 46 LDL 143 01/25/2020 liver function normal TSH normal  Physical Exam:    VS:  BP 102/66   Pulse 80   Temp (!) 96.5 F (35.8 C)   Ht '5\' 2"'$  (1.575 m)   Wt 144 lb (65.3 kg)   SpO2 95%   BMI 26.34 kg/m     Wt Readings from Last 3 Encounters:  02/01/20 144 lb (  65.3 kg)  11/29/18 141 lb 6.4 oz (64.1 kg)  12/23/15 141 lb 6 oz (64.1 kg)     GEN:  Well nourished, well developed in no acute distress HEENT: Normal NECK: No JVD; No carotid bruits LYMPHATICS: No lymphadenopathy CARDIAC: RRR, no murmurs, rubs, gallops RESPIRATORY:  Clear to auscultation without rales, wheezing or rhonchi  ABDOMEN: Soft, non-tender, non-distended MUSCULOSKELETAL:  No edema; No deformity  SKIN: Warm and dry NEUROLOGIC:  Alert and oriented x 3 PSYCHIATRIC:  Normal affect    Signed, Jenna More, MD  02/01/2020 4:57 PM    [facility]

## 2020-02-01 NOTE — Patient Instructions (Addendum)
Medication Instructions:  Your physician has recommended you make the following change in your medication:  1.  STOP the Pravastain 2.  START the Rosuvastatin 10 mg taking 1 tablet daily with your evening meal 3.  START Aspirin 81 mg daily  *If you need a refill on your cardiac medications before your next appointment, please call your pharmacy*  Lab Work: 4 WEEKS:  COME TO THE OFFICE, FASTING, NOTHING TO EAT OR DRINK AFTER MIDNIGHT THE NIGHT BEFORE, EXCEPT BLACK COFFEE OR WATER  If you have labs (blood work) drawn today and your tests are completely normal, you will receive your results only by: Marland Kitchen MyChart Message (if you have MyChart) OR . A paper copy in the mail If you have any lab test that is abnormal or we need to change your treatment, we will call you to review the results.  Testing/Procedures: None ordered  Follow-Up: At [facility], you and your health needs are our priority.  As part of our continuing mission to provide you with exceptional heart care, we have created designated Provider Care Teams.  These Care Teams include your primary Cardiologist (physician) and Advanced Practice Providers (APPs -  Physician Assistants and Nurse Practitioners) who all work together to provide you with the care you need, when you need it.  Your next appointment:   3 month(s)  The format for your next appointment:   In Person  Provider:   Shirlee More, MD  Other Instructions

## 2020-02-02 ENCOUNTER — Other Ambulatory Visit: Payer: Self-pay | Admitting: Cardiology

## 2020-02-29 ENCOUNTER — Other Ambulatory Visit: Payer: Self-pay | Admitting: *Deleted

## 2020-02-29 DIAGNOSIS — R931 Abnormal findings on diagnostic imaging of heart and coronary circulation: Secondary | ICD-10-CM

## 2020-03-01 LAB — LIPID PANEL
Chol/HDL Ratio: 3.7 ratio (ref 0.0–4.4)
Cholesterol, Total: 155 mg/dL (ref 100–199)
HDL: 42 mg/dL (ref >39)
LDL Chol Calc (NIH): 80 mg/dL (ref 0–99)
Triglycerides: 195 mg/dL — ABNORMAL HIGH (ref 0–149)
VLDL Cholesterol Cal: 33 mg/dL (ref 5–40)

## 2020-03-02 LAB — LIPOPROTEIN A (LPA): Lipoprotein (a): 206 nmol/L — ABNORMAL HIGH (ref <75.0)

## 2020-03-03 ENCOUNTER — Telehealth: Payer: Self-pay | Admitting: Emergency Medicine

## 2020-03-03 NOTE — Telephone Encounter (Signed)
error 

## 2020-05-23 ENCOUNTER — Other Ambulatory Visit: Payer: Self-pay

## 2020-05-25 NOTE — Progress Notes (Signed)
Cardiology Office Note:    Date:  05/26/2020   ID:  Jenna Li, [place] 01/07/1956, MRN [id number]  PCP:  Harlan Stains, MD  Cardiologist:  Shirlee More, MD    Referring MD: Harlan Stains, MD    ASSESSMENT:    1. Elevated coronary artery calcium score   2. Dyslipidemia   3. Elevated lipoprotein(a)    PLAN:    In order of problems listed above:  1. Continue high intensity statin recheck lipid profile LP(a) level and if LDL remains above goal consider coincident PCSK9 therapy.   Next appointment: 6 months   Medication Adjustments/Labs and Tests Ordered: Current medicines are reviewed at length with the patient today.  Concerns regarding medicines are outlined above.  Orders Placed This Encounter  Procedures  . Lipoprotein A (LPA)  . Comprehensive metabolic panel  . Lipid panel   No orders of the defined types were placed in this encounter.   No chief complaint on file.   History of Present Illness:    Jenna Li is a 64 y.o. female with a hx of rapid HR, withStage 1 Right breast cancer and rapid HR last seen 11/29/2018.She is very health conscious because of breast cancer with surgery right breast radiation and hormone receptor blockade last seen 02/01/2020. Compliance with diet, lifestyle and medications: Yes  She is seen in follow-up tolerates lipid-lowering therapy without muscle pain or weakness recheck a lipid profile liver function LP(a) level.  She having no anginal symptoms dyspnea palpitation or syncope.  Cardiac CT 12/23/2019: Independently reviewed FINDINGS: Non-cardiac: See separate report from [facility]. Ascending Aorta: Normal size. 3.3 cm. No calcification. Pericardium: Normal Coronary arteries: Mild calcification noted in the distal RCA.  IMPRESSION: Coronary calcium score of 1.67 Agatson units. This was 58th percentile for age and sex matched control   Ref Range & Units 2 mo ago  Lipoprotein (a) <75.0 nmol/L  206.0High    Recent lab 05/22/2020: CMP normal potassium 4.1 creatinine 0.74 normal liver function GFR 85 cc/min CBC normal hemoglobin 13.9  Past Medical History:  Diagnosis Date  . Anatomical narrow angle of both eyes 02/09/2013  . Basal cell carcinoma of face   . Breast cancer ([place])   . Colitis - acute nonspecific at colonoscopy 08/28/2014  . Conjunctival cyst of right eye 02/09/2013  . Crohn's colitis - suspected 08/28/2014  . Elevated coronary artery calcium score 02/01/2020  . Encounter for cardiac risk counseling 11/29/2018  . Frequency of micturition 11/28/2012  . Heart murmur   . History of pregnancy 02/22/2013   Overview:  Menopause early 18s, on ERT since  Formatting of this note might be different from the original. Menopause early 4s, on ERT since  . Insomnia 02/08/2013  . Interstitial cystitis   . Interstitial cystitis (chronic) without hematuria 11/28/2012  . Kidney stones   . Malignant neoplasm of right breast, stage 1 ([place]) 06/22/2015  . Menopausal vaginal dryness 05/20/2019  . Menopause   . Migraine headache   . Nephrolithiasis 10/12/2013  . Nuclear sclerotic cataract of both eyes 03/02/2013  . Osteoporosis of forearm 09/22/2017  . Palpitation 11/29/2018  . Seasonal allergies   . Tachycardia 11/29/2018  . Urinary urgency 10/12/2012  . Uterine fibroid 08/28/2013   Overview:  INcidental finding on TVUS in 08/2012 to work up pelvic pain.  3 cm posterior fibroid and 2 cm intramural central fibroid  Formatting of this note might be different from the original. INcidental finding on TVUS in 08/2012 to work up  pelvic pain.  3 cm posterior fibroid and 2 cm intramural central fibroid    Past Surgical History:  Procedure Laterality Date  . BREAST LUMPECTOMY    . CESAREAN SECTION    . COLONOSCOPY    . TONSILLECTOMY      Current Medications: Current Meds  Medication Sig  . anastrozole (ARIMIDEX) 1 MG tablet Take 1 tablet by mouth daily.  Marland Kitchen aspirin EC 81 MG tablet Take 1 tablet  (81 mg total) by mouth daily.  . celecoxib (CELEBREX) 200 MG capsule Take 200 mg by mouth daily.  Marland Kitchen escitalopram (LEXAPRO) 10 MG tablet Take 10 mg by mouth daily.  . eszopiclone (LUNESTA) 2 MG TABS Take 2 mg by mouth at bedtime. Take immediately before bedtime - takes 1/2 tablet as needed  . exemestane (AROMASIN) 25 MG tablet Take 25 mg by mouth daily.  . mirabegron ER (MYRBETRIQ) 50 MG TB24 tablet Take 50 mg by mouth daily.  . nitrofurantoin (MACRODANTIN) 50 MG capsule Take 50 mg by mouth at bedtime.  . propranolol ER (INDERAL LA) 60 MG 24 hr capsule Take 60 mg by mouth daily.  . SUMAtriptan (IMITREX) 100 MG tablet TAKE 1 TAB AT ONSET OF HEADACHE, REPEAT IN 2 HOURS X 1 IF NEEDED ORALLY  . tretinoin (RETIN-A) 0.05 % cream USE 1 APPLICATION TOPICALLY QHS     Allergies:   Patient has no known allergies.   Social History   Socioeconomic History  . Marital status: Married    Spouse name: Not on file  . Number of children: Not on file  . Years of education: Not on file  . Highest education level: Not on file  Occupational History  . Not on file  Tobacco Use  . Smoking status: Never Smoker  . Smokeless tobacco: Never Used  Substance and Sexual Activity  . Alcohol use: Yes    Alcohol/week: 1.0 standard drinks    Types: 1 Glasses of wine per week    Comment: 1 glass of wine on weekends  . Drug use: No  . Sexual activity: Yes    Birth control/protection: Post-menopausal  Other Topics Concern  . Not on file  Social History Narrative  . Not on file   Social Determinants of Health   Financial Resource Strain:   . Difficulty of Paying Living Expenses:   Food Insecurity:   . Worried About Charity fundraiser in the Last Year:   . Arboriculturist in the Last Year:   Transportation Needs:   . Film/video editor (Medical):   Marland Kitchen Lack of Transportation (Non-Medical):   Physical Activity:   . Days of Exercise per Week:   . Minutes of Exercise per Session:   Stress:   . Feeling of  Stress :   Social Connections:   . Frequency of Communication with Friends and Family:   . Frequency of Social Gatherings with Friends and Family:   . Attends Religious Services:   . Active Member of Clubs or Organizations:   . Attends Archivist Meetings:   Marland Kitchen Marital Status:      Family History: The patient's family history includes Atrial fibrillation in her mother; Heart disease in her father; Hyperlipidemia in her brother, brother, and father; Multiple myeloma in her paternal grandmother. There is no history of Colon cancer, Esophageal cancer, Stomach cancer, Kidney disease, Liver disease, or Diabetes. ROS:   Please see the history of present illness.    All other systems reviewed and are  negative.  EKGs/Labs/Other Studies Reviewed:    The following studies were reviewed today:   Recent Labs: No results found for requested labs within last 8760 hours.  Recent Lipid Panel    Component Value Date/Time   CHOL 155 02/29/2020 1456   TRIG 195 (H) 02/29/2020 1456   HDL 42 02/29/2020 1456   CHOLHDL 3.7 02/29/2020 1456   LDLCALC 80 02/29/2020 1456    Physical Exam:    VS:  BP 130/72 (BP Location: Right Arm, Patient Position: Sitting, Cuff Size: Normal)   Pulse 83   Ht '5\' 2"'$  (1.575 m)   Wt 143 lb 1.9 oz (64.9 kg)   SpO2 96%   BMI 26.18 kg/m     Wt Readings from Last 3 Encounters:  05/26/20 143 lb 1.9 oz (64.9 kg)  02/01/20 144 lb (65.3 kg)  11/29/18 141 lb 6.4 oz (64.1 kg)     GEN:  Well nourished, well developed in no acute distress HEENT: Normal NECK: No JVD; No carotid bruits LYMPHATICS: No lymphadenopathy CARDIAC: RRR, no murmurs, rubs, gallops RESPIRATORY:  Clear to auscultation without rales, wheezing or rhonchi  ABDOMEN: Soft, non-tender, non-distended MUSCULOSKELETAL:  No edema; No deformity  SKIN: Warm and dry NEUROLOGIC:  Alert and oriented x 3 PSYCHIATRIC:  Normal affect    Signed, Shirlee More, MD  05/26/2020 3:09 PM    [facility]

## 2020-05-26 ENCOUNTER — Ambulatory Visit (INDEPENDENT_AMBULATORY_CARE_PROVIDER_SITE_OTHER): Payer: Medicare PPO | Admitting: Cardiology

## 2020-05-26 ENCOUNTER — Encounter: Payer: Self-pay | Admitting: Cardiology

## 2020-05-26 ENCOUNTER — Other Ambulatory Visit: Payer: Self-pay

## 2020-05-26 VITALS — BP 130/72 | HR 83 | Ht 62.0 in | Wt 143.1 lb

## 2020-05-26 DIAGNOSIS — E785 Hyperlipidemia, unspecified: Secondary | ICD-10-CM | POA: Diagnosis not present

## 2020-05-26 DIAGNOSIS — E7841 Elevated Lipoprotein(a): Secondary | ICD-10-CM | POA: Diagnosis not present

## 2020-05-26 DIAGNOSIS — R931 Abnormal findings on diagnostic imaging of heart and coronary circulation: Secondary | ICD-10-CM | POA: Diagnosis not present

## 2020-05-26 NOTE — Patient Instructions (Signed)
Medication Instructions:  Your physician recommends that you continue on your current medications as directed. Please refer to the Current Medication list given to you today.  *If you need a refill on your cardiac medications before your next appointment, please call your pharmacy*   Lab Work: Your physician recommends that you return for lab work in: [place], Lpa, Lipids If you have labs (blood work) drawn today and your tests are completely normal, you will receive your results only by: Marland Kitchen MyChart Message (if you have MyChart) OR . A paper copy in the mail If you have any lab test that is abnormal or we need to change your treatment, we will call you to review the results.   Testing/Procedures: None   Follow-Up: At  [facility], you and your health needs are our priority.  As part of our continuing mission to provide you with exceptional heart care, we have created designated Provider Care Teams.  These Care Teams include your primary Cardiologist (physician) and Advanced Practice Providers (APPs -  Physician Assistants and Nurse Practitioners) who all work together to provide you with the care you need, when you need it.  We recommend signing up for the patient portal called "MyChart".  Sign up information is provided on this After Visit Summary.  MyChart is used to connect with patients for Virtual Visits (Telemedicine).  Patients are able to view lab/test results, encounter notes, upcoming appointments, etc.  Non-urgent messages can be sent to your provider as well.   To learn more about what you can do with MyChart, go to [email or web address].    Your next appointment:   6 month(s)  The format for your next appointment:   In Person  Provider:   Shirlee More, MD   Other Instructions

## 2020-05-27 ENCOUNTER — Telehealth: Payer: Self-pay

## 2020-05-27 LAB — COMPREHENSIVE METABOLIC PANEL
ALT: 9 IU/L (ref 0–32)
AST: 23 IU/L (ref 0–40)
Albumin/Globulin Ratio: 2.5 — ABNORMAL HIGH (ref 1.2–2.2)
Albumin: 5 g/dL — ABNORMAL HIGH (ref 3.8–4.8)
Alkaline Phosphatase: 121 IU/L (ref 48–121)
BUN/Creatinine Ratio: 17 (ref 12–28)
BUN: 11 mg/dL (ref 8–27)
Bilirubin Total: 0.6 mg/dL (ref 0.0–1.2)
CO2: 20 mmol/L (ref 20–29)
Calcium: 10 mg/dL (ref 8.7–10.3)
Chloride: 104 mmol/L (ref 96–106)
Creatinine, Ser: 0.65 mg/dL (ref 0.57–1.00)
GFR calc Af Amer: 108 mL/min/{1.73_m2} (ref >59)
GFR calc non Af Amer: 93 mL/min/{1.73_m2} (ref >59)
Globulin, Total: 2 g/dL (ref 1.5–4.5)
Glucose: 140 mg/dL — ABNORMAL HIGH (ref 65–99)
Potassium: 4.1 mmol/L (ref 3.5–5.2)
Sodium: 140 mmol/L (ref 134–144)
Total Protein: 7 g/dL (ref 6.0–8.5)

## 2020-05-27 LAB — LIPID PANEL
Chol/HDL Ratio: 3.8 ratio (ref 0.0–4.4)
Cholesterol, Total: 153 mg/dL (ref 100–199)
HDL: 40 mg/dL (ref >39)
LDL Chol Calc (NIH): 83 mg/dL (ref 0–99)
Triglycerides: 172 mg/dL — ABNORMAL HIGH (ref 0–149)
VLDL Cholesterol Cal: 30 mg/dL (ref 5–40)

## 2020-05-27 LAB — LIPOPROTEIN A (LPA): Lipoprotein (a): 176.9 nmol/L — ABNORMAL HIGH (ref <75.0)

## 2020-05-27 NOTE — Telephone Encounter (Signed)
Spoke with patient regarding results and recommendation.  Patient verbalizes understanding and is agreeable to plan of care. Advised patient to call back with any issues or concerns.  

## 2020-05-27 NOTE — Telephone Encounter (Signed)
-----   Message from Richardo Priest, MD sent at 05/27/2020  2:25 PM EDT ----- Normal or stable result  Good result her lipoprotein level is fallen 10 to 15% and I do think with the new medication comes unremarkable specific for the problem she would benefit from.  We can discuss at the next visit

## 2020-07-02 ENCOUNTER — Other Ambulatory Visit: Payer: Self-pay

## 2020-07-02 ENCOUNTER — Ambulatory Visit (INDEPENDENT_AMBULATORY_CARE_PROVIDER_SITE_OTHER): Payer: Medicare PPO

## 2020-07-02 ENCOUNTER — Ambulatory Visit: Payer: Medicare PPO | Admitting: Podiatry

## 2020-07-02 ENCOUNTER — Encounter: Payer: Self-pay | Admitting: Podiatry

## 2020-07-02 ENCOUNTER — Other Ambulatory Visit: Payer: Self-pay | Admitting: Podiatry

## 2020-07-02 DIAGNOSIS — M2042 Other hammer toe(s) (acquired), left foot: Secondary | ICD-10-CM

## 2020-07-02 DIAGNOSIS — M2041 Other hammer toe(s) (acquired), right foot: Secondary | ICD-10-CM

## 2020-07-02 DIAGNOSIS — R52 Pain, unspecified: Secondary | ICD-10-CM | POA: Diagnosis not present

## 2020-07-02 NOTE — Progress Notes (Signed)
Subjective:   Patient ID: Jenna Li, female   DOB: 64 y.o.   MRN: [id number]   HPI Patient presents stating I have chronic hammertoe deformity of my digits and blisters that occur at the end of several other toes.  They are all getting sore and the right is worse than the left and it is increasingly a problem for me to wear shoe gear.  I have tried shoe gear modifications tried trimming and other modalities without relief and patient does not smoke likes to be active   Review of Systems  All other systems reviewed and are negative.       Objective:  Physical Exam Vitals and nursing note reviewed.  Constitutional:      Appearance: She is well-developed.  Pulmonary:     Effort: Pulmonary effort is normal.  Musculoskeletal:        General: Normal range of motion.  Skin:    General: Skin is warm.  Neurological:     Mental Status: She is alert.     Neurovascular status intact muscle strength adequate range of motion within normal limits.  Patient is found to have elevated medially dislocated second digit right with prominence of the bone structure distal deformity of digits 3 and 4 of both feet and proximal deformity digit 5 both feet.  They are painful with distal lesions on the lesser digits and patient does have moderate metatarsus adductus deformity with no structural bunion deformity     Assessment:  Hammertoe deformity 2345 right 345 left with pain and deformity     Plan:  H&P reviewed conditions and at this point discussed treatment options.  She wants to get them fixed as her mother had terrible deformity and she wants to avoid that and I recommended digital fusion digit to right distal arthroplasty 3 and 4 bilateral proximal arthroplasty digit 5 of both feet.  Patient is scheduled for consultation prior to her desiring this procedure  X-rays indicate moderate metatarsus adductus deformity elevated second digit right distal rotation digits 3 4 both feet with prominent  proximal phalanx digit 5 bilateral

## 2020-09-15 ENCOUNTER — Other Ambulatory Visit: Payer: Self-pay | Admitting: Cardiology

## 2020-09-21 ENCOUNTER — Other Ambulatory Visit: Payer: Self-pay | Admitting: Cardiology

## 2020-10-29 DIAGNOSIS — M85852 Other specified disorders of bone density and structure, left thigh: Secondary | ICD-10-CM | POA: Diagnosis not present

## 2020-11-06 DIAGNOSIS — R350 Frequency of micturition: Secondary | ICD-10-CM | POA: Diagnosis not present

## 2020-11-11 DIAGNOSIS — M48062 Spinal stenosis, lumbar region with neurogenic claudication: Secondary | ICD-10-CM | POA: Diagnosis not present

## 2020-11-11 DIAGNOSIS — M5412 Radiculopathy, cervical region: Secondary | ICD-10-CM | POA: Diagnosis not present

## 2020-11-11 DIAGNOSIS — M50122 Cervical disc disorder at C5-C6 level with radiculopathy: Secondary | ICD-10-CM | POA: Diagnosis not present

## 2020-11-11 DIAGNOSIS — M5136 Other intervertebral disc degeneration, lumbar region: Secondary | ICD-10-CM | POA: Diagnosis not present

## 2020-11-11 DIAGNOSIS — M4316 Spondylolisthesis, lumbar region: Secondary | ICD-10-CM | POA: Diagnosis not present

## 2020-11-20 DIAGNOSIS — N200 Calculus of kidney: Secondary | ICD-10-CM | POA: Insufficient documentation

## 2020-11-20 DIAGNOSIS — C50919 Malignant neoplasm of unspecified site of unspecified female breast: Secondary | ICD-10-CM | POA: Insufficient documentation

## 2020-11-20 DIAGNOSIS — N3010 Interstitial cystitis (chronic) without hematuria: Secondary | ICD-10-CM | POA: Insufficient documentation

## 2020-11-20 DIAGNOSIS — R011 Cardiac murmur, unspecified: Secondary | ICD-10-CM | POA: Insufficient documentation

## 2020-11-20 DIAGNOSIS — C44310 Basal cell carcinoma of skin of unspecified parts of face: Secondary | ICD-10-CM | POA: Insufficient documentation

## 2020-11-20 DIAGNOSIS — J302 Other seasonal allergic rhinitis: Secondary | ICD-10-CM | POA: Insufficient documentation

## 2020-11-24 DIAGNOSIS — M79602 Pain in left arm: Secondary | ICD-10-CM | POA: Diagnosis not present

## 2020-11-24 DIAGNOSIS — C50911 Malignant neoplasm of unspecified site of right female breast: Secondary | ICD-10-CM | POA: Diagnosis not present

## 2020-11-24 DIAGNOSIS — Z08 Encounter for follow-up examination after completed treatment for malignant neoplasm: Secondary | ICD-10-CM | POA: Diagnosis not present

## 2020-11-24 DIAGNOSIS — Z79811 Long term (current) use of aromatase inhibitors: Secondary | ICD-10-CM | POA: Diagnosis not present

## 2020-11-24 DIAGNOSIS — M810 Age-related osteoporosis without current pathological fracture: Secondary | ICD-10-CM | POA: Diagnosis not present

## 2020-11-24 DIAGNOSIS — Z5181 Encounter for therapeutic drug level monitoring: Secondary | ICD-10-CM | POA: Diagnosis not present

## 2020-11-24 DIAGNOSIS — M85852 Other specified disorders of bone density and structure, left thigh: Secondary | ICD-10-CM | POA: Diagnosis not present

## 2020-11-24 DIAGNOSIS — R519 Headache, unspecified: Secondary | ICD-10-CM | POA: Diagnosis not present

## 2020-11-24 DIAGNOSIS — Z853 Personal history of malignant neoplasm of breast: Secondary | ICD-10-CM | POA: Diagnosis not present

## 2020-11-24 DIAGNOSIS — Z79899 Other long term (current) drug therapy: Secondary | ICD-10-CM | POA: Diagnosis not present

## 2020-11-24 DIAGNOSIS — M4720 Other spondylosis with radiculopathy, site unspecified: Secondary | ICD-10-CM | POA: Diagnosis not present

## 2020-11-24 DIAGNOSIS — Z923 Personal history of irradiation: Secondary | ICD-10-CM | POA: Diagnosis not present

## 2020-11-30 NOTE — Progress Notes (Signed)
Cardiology Office Note:    Date:  12/01/2020   ID:  Jenna Li, [place] November 09, 1956, MRN [id number]  PCP:  Harlan Stains, MD  Cardiologist:  Shirlee More, MD    Referring MD: Harlan Stains, MD    ASSESSMENT:    1. Elevated lipoprotein(a)   2. Palpitation   3. Thoracic aorta atherosclerosis ([place])   4. Agatston coronary artery calcium score less than 100    PLAN:    In order of problems listed above:  1. She has strong family history of CAD dyslipidemia with elevated LP(a) continue high intensity statin for now awaiting the antisensing oligonucleotide specific for the disorder check labs today LP(a) level lipids CMP.   Next appointment: 6 months   Medication Adjustments/Labs and Tests Ordered: Current medicines are reviewed at length with the patient today.  Concerns regarding medicines are outlined above.  Orders Placed This Encounter  Procedures  . Lipid panel  . Lipoprotein A (LPA)  . Comprehensive metabolic panel  . EKG 12-Lead   No orders of the defined types were placed in this encounter.   No chief complaint on file.   History of Present Illness:    Jenna Li is a 64 y.o. female with a hx of stage I right breast cancer elevated LP(a) and dyslipidemia.  Cardiac CT 12/23/2019 showed the ascending aorta normal in caliber mild calcification noted in the distal right coronary artery with a score 1.67- 58th percentile age and sex.  She was last seen 06 07 2021. Compliance with diet, lifestyle and medications: Yes  Tolerates statin without side effects or muscle pain or weakness. Wearing a mask instead shortness of breath climbing stairs not severe sustained and no chest pain.   Ref Range & Units 9 mo ago  Lipoprotein (a) <75.0 nmol/L 206.[place]     Past Medical History:  Diagnosis Date  . Anatomical narrow angle of both eyes 02/09/2013  . Basal cell carcinoma of face   . Breast cancer ([place])   . Colitis - acute nonspecific at colonoscopy 08/28/2014   . Conjunctival cyst of right eye 02/09/2013  . Crohn's colitis - suspected 08/28/2014  . Elevated coronary artery calcium score 02/01/2020  . Encounter for cardiac risk counseling 11/29/2018  . Frequency of micturition 11/28/2012  . Heart murmur   . History of pregnancy 02/22/2013   Overview:  Menopause early 48s, on ERT since  Formatting of this note might be different from the original. Menopause early 38s, on ERT since  . Insomnia 02/08/2013  . Interstitial cystitis   . Interstitial cystitis (chronic) without hematuria 11/28/2012  . Kidney stones   . Malignant neoplasm of right breast, stage 1 ([place]) 06/22/2015  . Menopausal vaginal dryness 05/20/2019  . Menopause   . Migraine headache   . Nephrolithiasis 10/12/2013  . Nuclear sclerotic cataract of both eyes 03/02/2013  . Osteoporosis of forearm 09/22/2017  . Palpitation 11/29/2018  . Seasonal allergies   . Tachycardia 11/29/2018  . Urinary urgency 10/12/2012  . Uterine fibroid 08/28/2013   Overview:  INcidental finding on TVUS in 08/2012 to work up pelvic pain.  3 cm posterior fibroid and 2 cm intramural central fibroid  Formatting of this note might be different from the original. INcidental finding on TVUS in 08/2012 to work up pelvic pain.  3 cm posterior fibroid and 2 cm intramural central fibroid    Past Surgical History:  Procedure Laterality Date  . BREAST LUMPECTOMY    . CESAREAN SECTION    .  COLONOSCOPY    . TONSILLECTOMY      Current Medications: Current Meds  Medication Sig  . celecoxib (CELEBREX) 200 MG capsule Take 200 mg by mouth daily.  . eszopiclone (LUNESTA) 2 MG TABS Take 2 mg by mouth at bedtime. Take immediately before bedtime - takes 1/2 tablet as needed  . mirabegron ER (MYRBETRIQ) 50 MG TB24 tablet Take 50 mg by mouth daily.  . nitrofurantoin (MACRODANTIN) 50 MG capsule Take 50 mg by mouth at bedtime.  . propranolol ER (INDERAL LA) 60 MG 24 hr capsule Take 60 mg by mouth daily.  . rosuvastatin (CRESTOR) 10 MG  tablet Take 10 mg by mouth daily.  . SUMAtriptan (IMITREX) 100 MG tablet TAKE 1 TAB AT ONSET OF HEADACHE, REPEAT IN 2 HOURS X 1 IF NEEDED ORALLY  . tretinoin (RETIN-A) 0.05 % cream USE 1 APPLICATION TOPICALLY QHS     Allergies:   Patient has no known allergies.   Social History   Socioeconomic History  . Marital status: Married    Spouse name: Not on file  . Number of children: Not on file  . Years of education: Not on file  . Highest education level: Not on file  Occupational History  . Not on file  Tobacco Use  . Smoking status: Never Smoker  . Smokeless tobacco: Never Used  Vaping Use  . Vaping Use: Never used  Substance and Sexual Activity  . Alcohol use: Yes    Alcohol/week: 1.0 standard drink    Types: 1 Glasses of wine per week    Comment: 1 glass of wine on weekends  . Drug use: No  . Sexual activity: Yes    Birth control/protection: Post-menopausal  Other Topics Concern  . Not on file  Social History Narrative  . Not on file   Social Determinants of Health   Financial Resource Strain: Not on file  Food Insecurity: Not on file  Transportation Needs: Not on file  Physical Activity: Not on file  Stress: Not on file  Social Connections: Not on file     Family History: The patient's family history includes Atrial fibrillation in her mother; Heart disease in her father; Hyperlipidemia in her brother, brother, and father; Multiple myeloma in her paternal grandmother. There is no history of Colon cancer, Esophageal cancer, Stomach cancer, Kidney disease, Liver disease, or Diabetes. ROS:   Please see the history of present illness.    All other systems reviewed and are negative.  EKGs/Labs/Other Studies Reviewed:    The following studies were reviewed today:  EKG:  EKG ordered today and personally reviewed.  The ekg ordered today demonstrates sinus rhythm incomplete right bundle branch block otherwise normal  Recent Labs: 05/26/2020: ALT 9; BUN 11; Creatinine,  Ser 0.65; Potassium 4.1; Sodium 140  Recent Lipid Panel    Component Value Date/Time   CHOL 153 05/26/2020 1451   TRIG 172 (H) 05/26/2020 1451   HDL 40 05/26/2020 1451   CHOLHDL 3.8 05/26/2020 1451   LDLCALC 83 05/26/2020 1451    Physical Exam:    VS:  BP 111/74   Pulse 68   Ht $R'5\' 2"'Pr$  (1.575 m)   Wt 146 lb 1.3 oz (66.3 kg)   SpO2 96%   BMI 26.72 kg/m     Wt Readings from Last 3 Encounters:  12/01/20 146 lb 1.3 oz (66.3 kg)  05/26/20 143 lb 1.9 oz (64.9 kg)  02/01/20 144 lb (65.3 kg)     GEN:  Well nourished, well  developed in no acute distress HEENT: Normal NECK: No JVD; No carotid bruits LYMPHATICS: No lymphadenopathy CARDIAC: RRR, no murmurs, rubs, gallops RESPIRATORY:  Clear to auscultation without rales, wheezing or rhonchi  ABDOMEN: Soft, non-tender, non-distended MUSCULOSKELETAL:  No edema; No deformity  SKIN: Warm and dry NEUROLOGIC:  Alert and oriented x 3 PSYCHIATRIC:  Normal affect    Signed, Shirlee More, MD  12/01/2020 2:57 PM    [facility]

## 2020-12-01 ENCOUNTER — Encounter: Payer: Self-pay | Admitting: Cardiology

## 2020-12-01 ENCOUNTER — Ambulatory Visit: Payer: Medicare PPO | Admitting: Cardiology

## 2020-12-01 ENCOUNTER — Other Ambulatory Visit: Payer: Self-pay

## 2020-12-01 VITALS — BP 111/74 | HR 68 | Ht 62.0 in | Wt 146.1 lb

## 2020-12-01 DIAGNOSIS — R002 Palpitations: Secondary | ICD-10-CM

## 2020-12-01 DIAGNOSIS — E7841 Elevated Lipoprotein(a): Secondary | ICD-10-CM | POA: Diagnosis not present

## 2020-12-01 DIAGNOSIS — I700 Atherosclerosis of aorta: Secondary | ICD-10-CM | POA: Diagnosis not present

## 2020-12-01 DIAGNOSIS — R931 Abnormal findings on diagnostic imaging of heart and coronary circulation: Secondary | ICD-10-CM

## 2020-12-01 NOTE — Patient Instructions (Signed)
Medication Instructions:  Your physician recommends that you continue on your current medications as directed. Please refer to the Current Medication list given to you today.  *If you need a refill on your cardiac medications before your next appointment, please call your pharmacy*   Lab Work: Your physician recommends that you return for lab work in: TODAY CMP, Lipids, LPA If you have labs (blood work) drawn today and your tests are completely normal, you will receive your results only by: Marland Kitchen MyChart Message (if you have MyChart) OR . A paper copy in the mail If you have any lab test that is abnormal or we need to change your treatment, we will call you to review the results.   Testing/Procedures: None   Follow-Up: At [facility], you and your health needs are our priority.  As part of our continuing mission to provide you with exceptional heart care, we have created designated Provider Care Teams.  These Care Teams include your primary Cardiologist (physician) and Advanced Practice Providers (APPs -  Physician Assistants and Nurse Practitioners) who all work together to provide you with the care you need, when you need it.  We recommend signing up for the patient portal called "MyChart".  Sign up information is provided on this After Visit Summary.  MyChart is used to connect with patients for Virtual Visits (Telemedicine).  Patients are able to view lab/test results, encounter notes, upcoming appointments, etc.  Non-urgent messages can be sent to your provider as well.   To learn more about what you can do with MyChart, go to [email or web address].    Your next appointment:   6 month(s)  The format for your next appointment:   In Person  Provider:   Shirlee More, MD   Other Instructions

## 2020-12-02 ENCOUNTER — Telehealth: Payer: Self-pay

## 2020-12-02 DIAGNOSIS — M85852 Other specified disorders of bone density and structure, left thigh: Secondary | ICD-10-CM | POA: Diagnosis not present

## 2020-12-02 DIAGNOSIS — M810 Age-related osteoporosis without current pathological fracture: Secondary | ICD-10-CM | POA: Diagnosis not present

## 2020-12-02 DIAGNOSIS — Z7189 Other specified counseling: Secondary | ICD-10-CM | POA: Diagnosis not present

## 2020-12-02 DIAGNOSIS — Z79811 Long term (current) use of aromatase inhibitors: Secondary | ICD-10-CM | POA: Diagnosis not present

## 2020-12-02 LAB — LIPOPROTEIN A (LPA): Lipoprotein (a): 193.8 nmol/L — ABNORMAL HIGH (ref <75.0)

## 2020-12-02 LAB — COMPREHENSIVE METABOLIC PANEL
ALT: 15 IU/L (ref 0–32)
AST: 29 IU/L (ref 0–40)
Albumin/Globulin Ratio: 2.3 — ABNORMAL HIGH (ref 1.2–2.2)
Albumin: 5 g/dL — ABNORMAL HIGH (ref 3.8–4.8)
Alkaline Phosphatase: 148 IU/L — ABNORMAL HIGH (ref 44–121)
BUN/Creatinine Ratio: 27 (ref 12–28)
BUN: 19 mg/dL (ref 8–27)
Bilirubin Total: 0.6 mg/dL (ref 0.0–1.2)
CO2: 22 mmol/L (ref 20–29)
Calcium: 10.9 mg/dL — ABNORMAL HIGH (ref 8.7–10.3)
Chloride: 101 mmol/L (ref 96–106)
Creatinine, Ser: 0.7 mg/dL (ref 0.57–1.00)
GFR calc Af Amer: 105 mL/min/{1.73_m2} (ref >59)
GFR calc non Af Amer: 91 mL/min/{1.73_m2} (ref >59)
Globulin, Total: 2.2 g/dL (ref 1.5–4.5)
Glucose: 105 mg/dL — ABNORMAL HIGH (ref 65–99)
Potassium: 4.3 mmol/L (ref 3.5–5.2)
Sodium: 141 mmol/L (ref 134–144)
Total Protein: 7.2 g/dL (ref 6.0–8.5)

## 2020-12-02 LAB — LIPID PANEL
Chol/HDL Ratio: 3.8 ratio (ref 0.0–4.4)
Cholesterol, Total: 176 mg/dL (ref 100–199)
HDL: 46 mg/dL (ref >39)
LDL Chol Calc (NIH): 86 mg/dL (ref 0–99)
Triglycerides: 269 mg/dL — ABNORMAL HIGH (ref 0–149)
VLDL Cholesterol Cal: 44 mg/dL — ABNORMAL HIGH (ref 5–40)

## 2020-12-02 NOTE — Telephone Encounter (Signed)
Spoke with patient regarding results and recommendation.  Patient verbalizes understanding and is agreeable to plan of care. Advised patient to call back with any issues or concerns.  

## 2020-12-02 NOTE — Telephone Encounter (Signed)
-----   Message from Richardo Priest, MD sent at 12/02/2020  7:57 AM EST ----- LDL is at target  Lipoprotein a has not moved  Wait for the new medication to be on the market hopefully within the next 6 months

## 2020-12-09 ENCOUNTER — Ambulatory Visit: Payer: Medicare PPO | Admitting: Neurology

## 2020-12-16 DIAGNOSIS — G43709 Chronic migraine without aura, not intractable, without status migrainosus: Secondary | ICD-10-CM | POA: Diagnosis not present

## 2021-01-12 ENCOUNTER — Ambulatory Visit: Payer: Medicare PPO | Admitting: Neurology

## 2021-01-15 DIAGNOSIS — M4722 Other spondylosis with radiculopathy, cervical region: Secondary | ICD-10-CM | POA: Diagnosis not present

## 2021-01-15 DIAGNOSIS — M4802 Spinal stenosis, cervical region: Secondary | ICD-10-CM | POA: Diagnosis not present

## 2021-01-20 ENCOUNTER — Other Ambulatory Visit: Payer: Self-pay | Admitting: Cardiology

## 2021-02-05 DIAGNOSIS — L820 Inflamed seborrheic keratosis: Secondary | ICD-10-CM | POA: Diagnosis not present

## 2021-02-06 DIAGNOSIS — M810 Age-related osteoporosis without current pathological fracture: Secondary | ICD-10-CM | POA: Diagnosis not present

## 2021-02-12 ENCOUNTER — Telehealth: Payer: Self-pay | Admitting: Cardiology

## 2021-02-12 DIAGNOSIS — G43709 Chronic migraine without aura, not intractable, without status migrainosus: Secondary | ICD-10-CM | POA: Diagnosis not present

## 2021-02-12 DIAGNOSIS — H43813 Vitreous degeneration, bilateral: Secondary | ICD-10-CM | POA: Diagnosis not present

## 2021-02-12 DIAGNOSIS — H5203 Hypermetropia, bilateral: Secondary | ICD-10-CM | POA: Diagnosis not present

## 2021-02-12 NOTE — Telephone Encounter (Signed)
EKG was faxed to the provided number.

## 2021-02-12 NOTE — Telephone Encounter (Signed)
Patient needs her latest EKG faxed to Dr. Kerney Elbe office.   Fax number: [phone]

## 2021-02-13 DIAGNOSIS — J019 Acute sinusitis, unspecified: Secondary | ICD-10-CM | POA: Diagnosis not present

## 2021-03-10 DIAGNOSIS — G43709 Chronic migraine without aura, not intractable, without status migrainosus: Secondary | ICD-10-CM | POA: Diagnosis not present

## 2021-04-08 DIAGNOSIS — L820 Inflamed seborrheic keratosis: Secondary | ICD-10-CM | POA: Diagnosis not present

## 2021-04-08 DIAGNOSIS — L570 Actinic keratosis: Secondary | ICD-10-CM | POA: Diagnosis not present

## 2021-04-08 DIAGNOSIS — L578 Other skin changes due to chronic exposure to nonionizing radiation: Secondary | ICD-10-CM | POA: Diagnosis not present

## 2021-04-08 DIAGNOSIS — L821 Other seborrheic keratosis: Secondary | ICD-10-CM | POA: Diagnosis not present

## 2021-04-08 DIAGNOSIS — L603 Nail dystrophy: Secondary | ICD-10-CM | POA: Diagnosis not present

## 2021-04-13 DIAGNOSIS — N3941 Urge incontinence: Secondary | ICD-10-CM | POA: Diagnosis not present

## 2021-05-07 DIAGNOSIS — J0110 Acute frontal sinusitis, unspecified: Secondary | ICD-10-CM | POA: Diagnosis not present

## 2021-05-28 ENCOUNTER — Ambulatory Visit: Payer: Medicare PPO | Admitting: Cardiology

## 2021-05-28 DIAGNOSIS — Z79899 Other long term (current) drug therapy: Secondary | ICD-10-CM | POA: Diagnosis not present

## 2021-05-28 DIAGNOSIS — M79622 Pain in left upper arm: Secondary | ICD-10-CM | POA: Diagnosis not present

## 2021-05-28 DIAGNOSIS — M5442 Lumbago with sciatica, left side: Secondary | ICD-10-CM | POA: Diagnosis not present

## 2021-05-28 DIAGNOSIS — G43909 Migraine, unspecified, not intractable, without status migrainosus: Secondary | ICD-10-CM | POA: Diagnosis not present

## 2021-05-28 DIAGNOSIS — Z08 Encounter for follow-up examination after completed treatment for malignant neoplasm: Secondary | ICD-10-CM | POA: Diagnosis not present

## 2021-05-28 DIAGNOSIS — M810 Age-related osteoporosis without current pathological fracture: Secondary | ICD-10-CM | POA: Diagnosis not present

## 2021-05-28 DIAGNOSIS — Z853 Personal history of malignant neoplasm of breast: Secondary | ICD-10-CM | POA: Diagnosis not present

## 2021-05-28 DIAGNOSIS — C50911 Malignant neoplasm of unspecified site of right female breast: Secondary | ICD-10-CM | POA: Diagnosis not present

## 2021-08-19 ENCOUNTER — Encounter: Payer: Self-pay | Admitting: Cardiology

## 2021-08-19 ENCOUNTER — Other Ambulatory Visit: Payer: Self-pay

## 2021-08-19 ENCOUNTER — Ambulatory Visit: Payer: Medicare PPO | Admitting: Cardiology

## 2021-08-19 VITALS — BP 100/64 | HR 78 | Ht 62.0 in | Wt 142.1 lb

## 2021-08-19 DIAGNOSIS — R931 Abnormal findings on diagnostic imaging of heart and coronary circulation: Secondary | ICD-10-CM

## 2021-08-19 DIAGNOSIS — E7841 Elevated Lipoprotein(a): Secondary | ICD-10-CM

## 2021-08-19 NOTE — Progress Notes (Signed)
Cardiology Office Note:    Date:  08/19/2021   ID:  Jenna Li, [place] Sep 06, 1956, MRN [id number]  PCP:  Harlan Stains, MD  Cardiologist:  Shirlee More, MD    Referring MD: Harlan Stains, MD    ASSESSMENT:    1. Elevated lipoprotein(a)   2. Agatston coronary artery calcium score less than 100    PLAN:    In order of problems listed above:  She returns today as we are hoping that Inclisirin would be readily available and provide a good treatment choice for elevated LP(a).  Unfortunately its not been very effective for widely used and in her group without vascular disease is really not indicated.  I think at this time the best we can do is treat her to go with a statin if a second agent is needed Zetia or if his triglycerides prescription strength fish oil or icosapent ethyl.  She will have labs drawn tomorrow I will await a copy at this time is not having cardiovascular symptoms do not think she requires an ischemia evaluation.  She prefers to come back and be seen yearly as she is very concerned about the potential of vascular disease.   Next appointment: 1 year   Medication Adjustments/Labs and Tests Ordered: Current medicines are reviewed at length with the patient today.  Concerns regarding medicines are outlined above.  Orders Placed This Encounter  Procedures   Lipid panel   Lipoprotein A (LPA)   No orders of the defined types were placed in this encounter.   Follow-up with dyslipidemia elevators LP(a) level   History of Present Illness:    Jenna Li is a 65 y.o. female with a hx of stage I right breast cancer dyslipidemia elevated LP(a).  Cardiac CTA January 2021 showed calcification distal right coronary artery with a score 1.6 7-58 percentile.  She was last seen 12/01/2020.  Her lipid profile was optimal with an LDL of 86 cholesterol 176 and LP(a) level remains markedly elevated at 194.  Compliance with diet, lifestyle and medications: Yes she  tolerates her statin without muscle pain or weakness and is due tomorrow for fasting labs wellness exam given a handwritten note also check an LP(a) level along with her lipid profile She has had no cardiovascular symptoms chest pain edema shortness of breath palpitation or syncope. Past Medical History:  Diagnosis Date   Anatomical narrow angle of both eyes 02/09/2013   Basal cell carcinoma of face    Breast cancer ([place])    Colitis - acute nonspecific at colonoscopy 08/28/2014   Conjunctival cyst of right eye 02/09/2013   Crohn's colitis - suspected 08/28/2014   Elevated coronary artery calcium score 02/01/2020   Encounter for cardiac risk counseling 11/29/2018   Frequency of micturition 11/28/2012   Heart murmur    History of pregnancy 02/22/2013   Overview:  Menopause early 87s, on ERT since  Formatting of this note might be different from the original. Menopause early 50s, on ERT since   Insomnia 02/08/2013   Interstitial cystitis    Interstitial cystitis (chronic) without hematuria 11/28/2012   Kidney stones    Malignant neoplasm of right breast, stage 1 ([place]) 06/22/2015   Menopausal vaginal dryness 05/20/2019   Menopause    Migraine headache    Nephrolithiasis 10/12/2013   Nuclear sclerotic cataract of both eyes 03/02/2013   Osteoporosis of forearm 09/22/2017   Palpitation 11/29/2018   Seasonal allergies    Tachycardia 11/29/2018   Urinary urgency 10/12/2012  Uterine fibroid 08/28/2013   Overview:  INcidental finding on TVUS in 08/2012 to work up pelvic pain.  3 cm posterior fibroid and 2 cm intramural central fibroid  Formatting of this note might be different from the original. INcidental finding on TVUS in 08/2012 to work up pelvic pain.  3 cm posterior fibroid and 2 cm intramural central fibroid    Past Surgical History:  Procedure Laterality Date   BREAST LUMPECTOMY     CESAREAN SECTION     COLONOSCOPY     TONSILLECTOMY      Current Medications: Current Meds  Medication Sig    aspirin EC 81 MG tablet Take 1 tablet (81 mg total) by mouth daily.   celecoxib (CELEBREX) 200 MG capsule Take 200 mg by mouth daily.   Erenumab-aooe (AIMOVIG) 140 MG/ML SOAJ Inject 140 mg into the skin every 30 (thirty) days.   eszopiclone (LUNESTA) 2 MG TABS Take 2 mg by mouth at bedtime. Take immediately before bedtime - takes 1/2 tablet as needed   mirabegron ER (MYRBETRIQ) 50 MG TB24 tablet Take 50 mg by mouth daily.   naratriptan (AMERGE) 2.5 MG tablet Take 0.5 tablets by mouth daily as needed for migraine.   nitrofurantoin (MACRODANTIN) 50 MG capsule Take 50 mg by mouth at bedtime.   propranolol ER (INDERAL LA) 60 MG 24 hr capsule Take 60 mg by mouth daily.   rosuvastatin (CRESTOR) 10 MG tablet TAKE 1 TABLET BY MOUTH EVERY DAY   tretinoin (RETIN-A) 0.05 % cream USE 1 APPLICATION TOPICALLY QHS   [DISCONTINUED] SUMAtriptan (IMITREX) 100 MG tablet TAKE 1 TAB AT ONSET OF HEADACHE, REPEAT IN 2 HOURS X 1 IF NEEDED ORALLY     Allergies:   Patient has no known allergies.   Social History   Socioeconomic History   Marital status: Married    Spouse name: Not on file   Number of children: Not on file   Years of education: Not on file   Highest education level: Not on file  Occupational History   Not on file  Tobacco Use   Smoking status: Never   Smokeless tobacco: Never  Vaping Use   Vaping Use: Never used  Substance and Sexual Activity   Alcohol use: Yes    Alcohol/week: 1.0 standard drink    Types: 1 Glasses of wine per week    Comment: 1 glass of wine on weekends   Drug use: No   Sexual activity: Yes    Birth control/protection: Post-menopausal  Other Topics Concern   Not on file  Social History Narrative   Not on file   Social Determinants of Health   Financial Resource Strain: Not on file  Food Insecurity: Not on file  Transportation Needs: Not on file  Physical Activity: Not on file  Stress: Not on file  Social Connections: Not on file     Family History: The  patient's family history includes Atrial fibrillation in her mother; Heart disease in her father; Hyperlipidemia in her brother, brother, and father; Multiple myeloma in her paternal grandmother. There is no history of Colon cancer, Esophageal cancer, Stomach cancer, Kidney disease, Liver disease, or Diabetes. ROS:   Please see the history of present illness.    All other systems reviewed and are negative.  EKGs/Labs/Other Studies Reviewed:    The following studies were reviewed today:    Recent Labs: 12/01/2020: ALT 15; BUN 19; Creatinine, Ser 0.70; Potassium 4.3; Sodium 141  Recent Lipid Panel    Component Value  Date/Time   CHOL 176 12/01/2020 1459   TRIG 269 (H) 12/01/2020 1459   HDL 46 12/01/2020 1459   CHOLHDL 3.8 12/01/2020 1459   LDLCALC 86 12/01/2020 1459    Physical Exam:    VS:  BP 100/64 (BP Location: Right Arm, Patient Position: Sitting, Cuff Size: Normal)   Pulse 78   Ht $R'5\' 2"'tG$  (1.575 m)   Wt 142 lb 1.9 oz (64.5 kg)   SpO2 97%   BMI 25.99 kg/m     Wt Readings from Last 3 Encounters:  08/19/21 142 lb 1.9 oz (64.5 kg)  12/01/20 146 lb 1.3 oz (66.3 kg)  05/26/20 143 lb 1.9 oz (64.9 kg)     GEN: He has no xanthoma or xanthelasma well nourished, well developed in no acute distress HEENT: Normal NECK: No JVD; No carotid bruits LYMPHATICS: No lymphadenopathy CARDIAC: RRR, no murmurs, rubs, gallops RESPIRATORY:  Clear to auscultation without rales, wheezing or rhonchi  ABDOMEN: Soft, non-tender, non-distended MUSCULOSKELETAL:  No edema; No deformity  SKIN: Warm and dry NEUROLOGIC:  Alert and oriented x 3 PSYCHIATRIC:  Normal affect    Signed, Shirlee More, MD  08/19/2021 12:43 PM    [facility]

## 2021-08-19 NOTE — Patient Instructions (Signed)
Medication Instructions:  Your physician recommends that you continue on your current medications as directed. Please refer to the Current Medication list given to you today.  *If you need a refill on your cardiac medications before your next appointment, please call your pharmacy*   Lab Work: Your physician recommends that you return for lab work in: [place], [place] If you have labs (blood work) drawn today and your tests are completely normal, you will receive your results only by: MyChart Message (if you have MyChart) OR A paper copy in the mail If you have any lab test that is abnormal or we need to change your treatment, we will call you to review the results.   Testing/Procedures: None   Follow-Up: At [facility], you and your health needs are our priority.  As part of our continuing mission to provide you with exceptional heart care, we have created designated Provider Care Teams.  These Care Teams include your primary Cardiologist (physician) and Advanced Practice Providers (APPs -  Physician Assistants and Nurse Practitioners) who all work together to provide you with the care you need, when you need it.  We recommend signing up for the patient portal called "MyChart".  Sign up information is provided on this After Visit Summary.  MyChart is used to connect with patients for Virtual Visits (Telemedicine).  Patients are able to view lab/test results, encounter notes, upcoming appointments, etc.  Non-urgent messages can be sent to your provider as well.   To learn more about what you can do with MyChart, go to [email or web address].    Your next appointment:   1 year(s)  The format for your next appointment:   In Person  Provider:   Shirlee More, MD   Other Instructions

## 2021-08-20 DIAGNOSIS — E7841 Elevated Lipoprotein(a): Secondary | ICD-10-CM | POA: Diagnosis not present

## 2021-08-20 DIAGNOSIS — Z0000 Encounter for general adult medical examination without abnormal findings: Secondary | ICD-10-CM | POA: Diagnosis not present

## 2021-08-20 DIAGNOSIS — M5432 Sciatica, left side: Secondary | ICD-10-CM | POA: Diagnosis not present

## 2021-08-20 DIAGNOSIS — Z853 Personal history of malignant neoplasm of breast: Secondary | ICD-10-CM | POA: Diagnosis not present

## 2021-08-20 DIAGNOSIS — E785 Hyperlipidemia, unspecified: Secondary | ICD-10-CM | POA: Diagnosis not present

## 2021-08-20 DIAGNOSIS — Z23 Encounter for immunization: Secondary | ICD-10-CM | POA: Diagnosis not present

## 2021-09-01 DIAGNOSIS — R748 Abnormal levels of other serum enzymes: Secondary | ICD-10-CM | POA: Diagnosis not present

## 2021-09-07 DIAGNOSIS — R748 Abnormal levels of other serum enzymes: Secondary | ICD-10-CM | POA: Diagnosis not present

## 2021-09-14 DIAGNOSIS — N3010 Interstitial cystitis (chronic) without hematuria: Secondary | ICD-10-CM | POA: Diagnosis not present

## 2021-09-14 DIAGNOSIS — Z87442 Personal history of urinary calculi: Secondary | ICD-10-CM | POA: Diagnosis not present

## 2021-09-14 DIAGNOSIS — N200 Calculus of kidney: Secondary | ICD-10-CM | POA: Diagnosis not present

## 2021-09-14 DIAGNOSIS — N3941 Urge incontinence: Secondary | ICD-10-CM | POA: Diagnosis not present

## 2021-09-16 DIAGNOSIS — G43709 Chronic migraine without aura, not intractable, without status migrainosus: Secondary | ICD-10-CM | POA: Diagnosis not present

## 2021-09-16 DIAGNOSIS — Z79899 Other long term (current) drug therapy: Secondary | ICD-10-CM | POA: Diagnosis not present

## 2021-09-22 DIAGNOSIS — C50911 Malignant neoplasm of unspecified site of right female breast: Secondary | ICD-10-CM | POA: Diagnosis not present

## 2021-09-22 DIAGNOSIS — C50811 Malignant neoplasm of overlapping sites of right female breast: Secondary | ICD-10-CM | POA: Diagnosis not present

## 2021-09-22 DIAGNOSIS — Z170 Estrogen receptor positive status [ER+]: Secondary | ICD-10-CM | POA: Diagnosis not present

## 2021-09-22 DIAGNOSIS — R748 Abnormal levels of other serum enzymes: Secondary | ICD-10-CM | POA: Diagnosis not present

## 2021-09-22 DIAGNOSIS — R948 Abnormal results of function studies of other organs and systems: Secondary | ICD-10-CM | POA: Diagnosis not present

## 2021-09-23 DIAGNOSIS — R748 Abnormal levels of other serum enzymes: Secondary | ICD-10-CM | POA: Diagnosis not present

## 2021-09-23 DIAGNOSIS — Z170 Estrogen receptor positive status [ER+]: Secondary | ICD-10-CM | POA: Diagnosis not present

## 2021-09-23 DIAGNOSIS — E349 Endocrine disorder, unspecified: Secondary | ICD-10-CM | POA: Diagnosis not present

## 2021-09-23 DIAGNOSIS — C50911 Malignant neoplasm of unspecified site of right female breast: Secondary | ICD-10-CM | POA: Diagnosis not present

## 2021-09-23 DIAGNOSIS — C170 Malignant neoplasm of duodenum: Secondary | ICD-10-CM | POA: Diagnosis not present

## 2021-10-07 ENCOUNTER — Encounter: Payer: Self-pay | Admitting: Physician Assistant

## 2021-10-07 DIAGNOSIS — L578 Other skin changes due to chronic exposure to nonionizing radiation: Secondary | ICD-10-CM | POA: Diagnosis not present

## 2021-10-07 DIAGNOSIS — L820 Inflamed seborrheic keratosis: Secondary | ICD-10-CM | POA: Diagnosis not present

## 2021-10-07 DIAGNOSIS — L821 Other seborrheic keratosis: Secondary | ICD-10-CM | POA: Diagnosis not present

## 2021-10-07 DIAGNOSIS — L570 Actinic keratosis: Secondary | ICD-10-CM | POA: Diagnosis not present

## 2021-10-07 DIAGNOSIS — Z85828 Personal history of other malignant neoplasm of skin: Secondary | ICD-10-CM | POA: Diagnosis not present

## 2021-10-11 ENCOUNTER — Other Ambulatory Visit: Payer: Self-pay | Admitting: Cardiology

## 2021-10-15 DIAGNOSIS — Z9011 Acquired absence of right breast and nipple: Secondary | ICD-10-CM | POA: Diagnosis not present

## 2021-10-15 DIAGNOSIS — R749 Abnormal serum enzyme level, unspecified: Secondary | ICD-10-CM | POA: Diagnosis not present

## 2021-10-15 DIAGNOSIS — C50311 Malignant neoplasm of lower-inner quadrant of right female breast: Secondary | ICD-10-CM | POA: Diagnosis not present

## 2021-10-15 DIAGNOSIS — Z08 Encounter for follow-up examination after completed treatment for malignant neoplasm: Secondary | ICD-10-CM | POA: Diagnosis not present

## 2021-10-15 DIAGNOSIS — Z9223 Personal history of estrogen therapy: Secondary | ICD-10-CM | POA: Diagnosis not present

## 2021-10-15 DIAGNOSIS — Z853 Personal history of malignant neoplasm of breast: Secondary | ICD-10-CM | POA: Diagnosis not present

## 2021-10-15 DIAGNOSIS — Z923 Personal history of irradiation: Secondary | ICD-10-CM | POA: Diagnosis not present

## 2021-10-15 DIAGNOSIS — Z170 Estrogen receptor positive status [ER+]: Secondary | ICD-10-CM | POA: Diagnosis not present

## 2021-10-26 ENCOUNTER — Ambulatory Visit: Payer: Medicare PPO | Admitting: Physician Assistant

## 2021-10-26 ENCOUNTER — Other Ambulatory Visit (INDEPENDENT_AMBULATORY_CARE_PROVIDER_SITE_OTHER): Payer: Medicare PPO

## 2021-10-26 ENCOUNTER — Other Ambulatory Visit: Payer: Self-pay | Admitting: *Deleted

## 2021-10-26 ENCOUNTER — Encounter: Payer: Self-pay | Admitting: Physician Assistant

## 2021-10-26 VITALS — BP 100/60 | HR 72 | Ht 62.0 in | Wt 140.0 lb

## 2021-10-26 DIAGNOSIS — R748 Abnormal levels of other serum enzymes: Secondary | ICD-10-CM

## 2021-10-26 LAB — IBC + FERRITIN
Ferritin: 45.4 ng/mL (ref 10.0–291.0)
Iron: 127 ug/dL (ref 42–145)
Saturation Ratios: 34.1 % (ref 20.0–50.0)
TIBC: 372.4 ug/dL (ref 250.0–450.0)
Transferrin: 266 mg/dL (ref 212.0–360.0)

## 2021-10-26 LAB — HEPATIC FUNCTION PANEL
ALT: 7 U/L (ref 0–35)
AST: 20 U/L (ref 0–37)
Albumin: 4.7 g/dL (ref 3.5–5.2)
Alkaline Phosphatase: 131 U/L — ABNORMAL HIGH (ref 39–117)
Bilirubin, Direct: 0.1 mg/dL (ref 0.0–0.3)
Total Bilirubin: 0.7 mg/dL (ref 0.2–1.2)
Total Protein: 7.3 g/dL (ref 6.0–8.3)

## 2021-10-26 NOTE — Addendum Note (Signed)
Addended by: Susy Manor on: 89/02/7341 03:11 PM   Modules accepted: Orders

## 2021-10-26 NOTE — Progress Notes (Addendum)
Chief Complaint: Elevated alkaline phosphatase  HPI:    Jenna Li is a 65 year old female with a past medical history of breast cancer and others listed below, known to Dr. Leone Payor, who was referred to me by Laurann Montana, MD for a complaint of elevated alkaline phosphatase.    07/11/2015 colonoscopy with nonbleeding mucosal ulceration at the cecum, small external and internal hemorrhoids.  Biopsy showed focal active colitis and she was given Lialda for 2 months.      12/23/2015 office visit with Dr. Leone Payor for colitis diagnosed at time of colonoscopy.  It was discussed that she could have Crohn's disease but she was doing well and was not and started on therapy.    08/21/2021 CMP with an alk phos elevated at 151, other liver enzymes normal.  CBC normal.    09/01/2021 alkaline phosphatase 155, bone fraction 38%, intestinal fraction 6%, liver fraction 56%.  Patient had a normal PTH vitamin D and GGT.    09/07/2021 GGT normal.  She was referred to oncology.    09/22/2021 alk phos 136.    09/22/2021 CT of the chest abdomen and pelvis with contrast showed a posterior right lobe hypodensity, less than 1 cm but most likely a cyst.  No evidence of metastatic disease.    09/22/2021 nuc med bone scan no suspicious radiotracer uptake.    10/15/2021 patient followed with hematology.  Was noted she had a right lumpectomy 07/23/2015 followed by radiation therapy and hormone therapy.  Was noted she had a negative bone scan for her elevated alk phos.  They told her to follow with GI and return to them in a year.    Today, the patient tells me that she feels fine.  Recalls a history of having an elevated alk phos for a period of time back in 2017 when she was going through all of her breast cancer work-up, but this had come back to normal at some point and she just forgot about it.  Now it has been elevated for at least the past 6 months.  Denies any family history of liver disease.  Describes negative work-up so far as  above.  Denies any GI symptoms.    Denies fever, chills, weight loss, blood in her stool or change in bowel habits.  Past Medical History:  Diagnosis Date   Anatomical narrow angle of both eyes 02/09/2013   Anxiety    Arthritis    Basal cell carcinoma of face    Breast cancer ([facility]) 2016   Colitis - acute nonspecific at colonoscopy 08/28/2014   Conjunctival cyst of right eye 02/09/2013   Crohn's colitis - suspected 08/28/2014   Elevated coronary artery calcium score 02/01/2020   Encounter for cardiac risk counseling 11/29/2018   Frequency of micturition 11/28/2012   Heart murmur    History of pregnancy 02/22/2013   Overview:  Menopause early 15s, on ERT since  Formatting of this note might be different from the original. Menopause early 50s, on ERT since   Hyperlipidemia    Insomnia 02/08/2013   Interstitial cystitis    Interstitial cystitis (chronic) without hematuria 11/28/2012   Kidney stones    Malignant neoplasm of right breast, stage 1 ([facility]) 06/22/2015   Menopausal vaginal dryness 05/20/2019   Menopause    Migraine headache    Nephrolithiasis 10/12/2013   Nuclear sclerotic cataract of both eyes 03/02/2013   Osteoporosis of forearm 09/22/2017   Palpitation 11/29/2018   Seasonal allergies    Tachycardia 11/29/2018  Urinary urgency 10/12/2012   Uterine fibroid 08/28/2013   Overview:  INcidental finding on TVUS in 08/2012 to work up pelvic pain.  3 cm posterior fibroid and 2 cm intramural central fibroid  Formatting of this note might be different from the original. INcidental finding on TVUS in 08/2012 to work up pelvic pain.  3 cm posterior fibroid and 2 cm intramural central fibroid    Past Surgical History:  Procedure Laterality Date   BREAST LUMPECTOMY Right    CESAREAN SECTION     x 2   COLONOSCOPY     TONSILLECTOMY      Current Outpatient Medications  Medication Sig Dispense Refill   aspirin EC 81 MG tablet Take 1 tablet (81 mg total) by mouth daily. 90 tablet  3   celecoxib (CELEBREX) 200 MG capsule Take 200 mg by mouth daily.     Erenumab-aooe (AIMOVIG) 140 MG/ML SOAJ Inject 140 mg into the skin every 30 (thirty) days.     eszopiclone (LUNESTA) 2 MG TABS Take 2 mg by mouth at bedtime. Take immediately before bedtime - takes 1/2 tablet as needed     mirabegron ER (MYRBETRIQ) 50 MG TB24 tablet Take 50 mg by mouth daily.     naratriptan (AMERGE) 2.5 MG tablet Take 0.5 tablets by mouth daily as needed for migraine.     nitrofurantoin (MACRODANTIN) 50 MG capsule Take 50 mg by mouth at bedtime.     propranolol ER (INDERAL LA) 60 MG 24 hr capsule Take 60 mg by mouth daily.  1   rosuvastatin (CRESTOR) 10 MG tablet TAKE 1 TABLET BY MOUTH EVERY DAY 90 tablet 2   tretinoin (RETIN-A) 0.05 % cream USE 1 APPLICATION TOPICALLY QHS     No current facility-administered medications for this visit.    Allergies as of 10/26/2021   (No Known Allergies)    Family History  Problem Relation Age of Onset   Atrial fibrillation Mother    Alzheimer's disease Mother    Heart disease Father    Hyperlipidemia Father    Hyperlipidemia Brother    Hyperlipidemia Brother    Multiple myeloma Paternal Grandmother    Colon cancer Neg Hx    Esophageal cancer Neg Hx    Stomach cancer Neg Hx    Kidney disease Neg Hx    Liver disease Neg Hx    Diabetes Neg Hx     Social History   Socioeconomic History   Marital status: Married    Spouse name: Not on file   Number of children: Not on file   Years of education: Not on file   Highest education level: Not on file  Occupational History   Not on file  Tobacco Use   Smoking status: Never   Smokeless tobacco: Never  Vaping Use   Vaping Use: Never used  Substance and Sexual Activity   Alcohol use: Yes    Alcohol/week: 1.0 standard drink    Types: 1 Glasses of wine per week    Comment: 1 glass of wine on weekends   Drug use: No   Sexual activity: Yes    Birth control/protection: Post-menopausal  Other Topics  Concern   Not on file  Social History Narrative   Not on file   Social Determinants of Health   Financial Resource Strain: Not on file  Food Insecurity: Not on file  Transportation Needs: Not on file  Physical Activity: Not on file  Stress: Not on file  Social Connections: Not on file  Intimate Partner Violence: Not on file    Review of Systems:    Constitutional: No weight loss, fever or chills Skin: No rash  Cardiovascular: No chest pain  Respiratory: No SOB  Gastrointestinal: See HPI and otherwise negative Genitourinary: No dysuria  Neurological: No headache, dizziness or syncope Musculoskeletal: No new muscle or joint pain Hematologic: No bleeding  Psychiatric: No history of depression or anxiety   Physical Exam:  Vital signs: BP 100/60   Pulse 72   Ht $R'5\' 2"'Ol$  (1.575 m)   Wt 140 lb (63.5 kg)   BMI 25.61 kg/m    Constitutional:   Pleasant Caucasian female appears to be in NAD, Well developed, Well nourished, alert and cooperative Head:  Normocephalic and atraumatic. Eyes:   PEERL, EOMI. No icterus. Conjunctiva pink. Ears:  Normal auditory acuity. Neck:  Supple Throat: Oral cavity and pharynx without inflammation, swelling or lesion.  Respiratory: Respirations even and unlabored. Lungs clear to auscultation bilaterally.   No wheezes, crackles, or rhonchi.  Cardiovascular: Normal S1, S2. No MRG. Regular rate and rhythm. No peripheral edema, cyanosis or pallor.  Gastrointestinal:  Soft, nondistended, nontender. No rebound or guarding. Normal bowel sounds. No appreciable masses or hepatomegaly. Rectal:  Not performed.  Msk:  Symmetrical without gross deformities. Without edema, no deformity or joint abnormality.  Neurologic:  Alert and  oriented x4;  grossly normal neurologically.  Skin:   Dry and intact without significant lesions or rashes. Psychiatric: Demonstrates good judgement and reason without abnormal affect or behaviors.  See HPI for recent labs and  imaging.  Assessment: 1.  Elevated alkaline phosphatase: See HPI for detailed lab work, GGT normal, differentiation with no strong determination where this is from, followed with her oncology team (history of breast cancer) they completed a nuc med bone scan and CT which did not show anything other than a possible cyst in the liver, patient feels well; consider autoimmune versus other  Plan: 1.  Ordered further liver labs today including ANA, ASMA, AMA, alpha-1 antitrypsin and ceruloplasmin as well as repeat hepatic panel and iron studies.  Discussed that pending these patient may need a liver biopsy. 2.  Patient to follow in clinic per recommendations after labs.  Ellouise Newer, PA-C [facility] 10/26/2021, 2:44 PM  Cc: Harlan Stains, MD    Lab Results  Component Value Date   ALT 7 10/26/2021   AST 20 10/26/2021   ALKPHOS 131 (H) 10/26/2021   BILITOT 0.7 10/26/2021     LFTs lower.  Mildly positive ANA on work-up so far.  She was on an aromatase inhibitor until December of last year.  Question if there is some sort of lag effect with that.  Those frequently cause elevation in alk phos.  Have communicated with Jenna Li and we will recheck alk phos in 3 months.  Gatha Mayer, MD, [facility]

## 2021-10-26 NOTE — Patient Instructions (Signed)
Your provider has requested that you go to the basement level for lab work before leaving today. Press "B" on the elevator. The lab is located at the first door on the left as you exit the elevator.  If you are age 66 or older, your body mass index should be between 23-30. Your Body mass index is 25.61 kg/m. If this is out of the aforementioned range listed, please consider follow up with your Primary Care Provider.  If you are age 87 or younger, your body mass index should be between 19-25. Your Body mass index is 25.61 kg/m. If this is out of the aformentioned range listed, please consider follow up with your Primary Care Provider.   ________________________________________________________  The [place] [facility] providers would like to encourage you to use [facility] to communicate with providers for non-urgent requests or questions.  Due to long hold times on the telephone, sending your provider a message by [facility] may be a faster and more efficient way to get a response.  Please allow 48 business hours for a response.  Please remember that this is for non-urgent requests.  _______________________________________________________

## 2021-10-29 LAB — CERULOPLASMIN: Ceruloplasmin: 29 mg/dL (ref 18–53)

## 2021-10-29 LAB — ANA: Anti Nuclear Antibody (ANA): POSITIVE — AB

## 2021-10-29 LAB — ALPHA-1-ANTITRYPSIN: A-1 Antitrypsin, Ser: 141 mg/dL (ref 83–199)

## 2021-10-29 LAB — IGA: Immunoglobulin A: 70 mg/dL (ref 70–320)

## 2021-10-29 LAB — MITOCHONDRIAL ANTIBODIES: Mitochondrial M2 Ab, IgG: <=20 U (ref <=20.0)

## 2021-10-29 LAB — ANTI-NUCLEAR AB-TITER (ANA TITER): ANA Titer 1: 1:40 {titer} — ABNORMAL HIGH

## 2021-10-29 LAB — ANTI-SMOOTH MUSCLE ANTIBODY, IGG: Actin (Smooth Muscle) Antibody (IGG): <20 U (ref <20)

## 2021-11-02 ENCOUNTER — Telehealth: Payer: Self-pay

## 2021-11-02 DIAGNOSIS — R748 Abnormal levels of other serum enzymes: Secondary | ICD-10-CM

## 2021-11-02 NOTE — Telephone Encounter (Signed)
Called and spoke with patient in regards to recommendations. She states that she has not been on Aromasin for over a year. Pt still is very anxious and had some concerns. She has been scheduled for a follow up with Dr. Carlean Purl on Tuesday, 12/22/21 at 2:30 pm. Patient verbalized understanding and had no concerns at the end of the call.   Lab order and reminder in epic. Lab reminder sent to Gillermina Hu, RN.

## 2021-11-02 NOTE — Telephone Encounter (Signed)
-----  Message from [place], [place] sent at 11/02/2021 10:19 AM EST ----- Regarding: FW: Next steps are elevated alk phos Can you please let patient know the below info from Dr. Darnell Level. We will recheck Alk phos in 3 mos.  Thanks-JLL ----- Message ----- From: Gatha Mayer, MD Sent: 10/29/2021   5:03 PM EST To: Levin Erp, PA Subject: Next steps are elevated alk phos               I do not think we need to go there yet.  I copied and pasted this below from her oncology note.  It looks like she stopped this medication in December per other information in her last oncology visit.  However these aromatase inhibitors frequently cause elevations in alkaline phosphatase and I wonder if there is not a lag effect.   I think we should recheck her alk phos in 3 months and confirm with her that she is off that medication.  FYI her TTG antibody is still pending.  Pasted section from oncology note 10/2015 - Hormonal Therapy  Initiated anastrozole (Arimidex) in 10/2015. Changed to exemestane (Aromasin) in 08/2016. Continues on exemestane 25 mg PO daily     ----- Message ----- From: Levin Erp, PA Sent: 10/26/2021   3:10 PM EST To: Gatha Mayer, MD

## 2021-12-22 ENCOUNTER — Ambulatory Visit: Payer: Medicare PPO | Admitting: Internal Medicine

## 2021-12-22 ENCOUNTER — Encounter: Payer: Self-pay | Admitting: Internal Medicine

## 2021-12-22 VITALS — BP 116/64 | HR 84 | Ht 62.0 in | Wt 141.0 lb

## 2021-12-22 DIAGNOSIS — K633 Ulcer of intestine: Secondary | ICD-10-CM | POA: Diagnosis not present

## 2021-12-22 DIAGNOSIS — R748 Abnormal levels of other serum enzymes: Secondary | ICD-10-CM | POA: Diagnosis not present

## 2021-12-22 NOTE — Patient Instructions (Signed)
Call us after your labs are done at [facility] and Dr Carlean Purl will be able to review in Care Everywhere.  If you are age 67 or older, your body mass index should be between 23-30. Your Body mass index is 25.79 kg/m. If this is out of the aforementioned range listed, please consider follow up with your Primary Care Provider.  If you are age 52 or younger, your body mass index should be between 19-25. Your Body mass index is 25.79 kg/m. If this is out of the aformentioned range listed, please consider follow up with your Primary Care Provider.   ________________________________________________________  The [place] [facility] providers would like to encourage you to use [facility] to communicate with providers for non-urgent requests or questions.  Due to long hold times on the telephone, sending your provider a message by [facility] may be a faster and more efficient way to get a response.  Please allow 48 business hours for a response.  Please remember that this is for non-urgent requests.  _______________________________________________________   I appreciate the opportunity to care for you. Silvano Rusk, MD,FACG

## 2021-12-22 NOTE — Progress Notes (Signed)
Jenna Li 66 y.o. 04/28/1956 [id number]  Assessment & Plan:   Encounter Diagnoses  Name Primary?   Elevated alkaline phosphatase level Yes   Ulceration of intestine    Because of elevated alkaline phosphatase level unclear.  Mildly positive ANA unlikely clinical significance.  She has upcoming follow-up labs at oncologist in [place] and when she has those done she will let me know and we will see what her alk phos is.  We are considering repeating a colonoscopy as it is possible that the alkaline phosphatase is from an intestinal source.  She had cecal ulcerations in 2017 and does have some episodic diarrhea related to stress typically she thinks but inflammatory bowel disease is in the differential.  CC: Harlan Stains, MD    Subjective:   Chief Complaint: Elevated alkaline phosphatase  HPI The patient is a 66 year old woman with a history of cecal ulcerations of unclear etiology at 2017 colonoscopy, and breast cancer, here for follow-up. She was sent to [place] and was seen by Ellouise Newer, PA-C in November with an elevated alkaline phosphatase level of unclear etiology.  Please see that note for further details.  In sum CT chest abdomen pelvis and a bone scan performed by oncology at [facility] were negative for any cause of the alk phos increase.  Fractionation of the alkaline phosphatase was unhelpful.   She had a serologic work-up performed in our office.  Alkaline phosphatase on November 7 was 131 it was 148 in December 2021 and 121 in June 2021.  Testing performed in November 2022 with ceruloplasmin, ANA, anti-smooth muscle antibody  mitochondrial antibodies, ferritin were all negative except for a positive ANA.  Were all negative except for positive ANA but the titer was 1-40 nuclear speckled pattern.  She has not had lab testing since.  She did take an aromatase inhibitor but that stopped in 2021.  Social history pertinent for her 10 year old mother is in the  hospital with a fractured hip in the setting of dementia.  Mom lives at [place].  There is also situational stressor with son and daughter-in-law trying in vitro fertilization. No Known Allergies Current Meds  Medication Sig   aspirin EC 81 MG tablet Take 1 tablet (81 mg total) by mouth daily.   celecoxib (CELEBREX) 200 MG capsule Take 200 mg by mouth daily.   Erenumab-aooe (AIMOVIG) 140 MG/ML SOAJ Inject 140 mg into the skin every 30 (thirty) days.   eszopiclone (LUNESTA) 2 MG TABS Take 2 mg by mouth at bedtime. Take immediately before bedtime - takes 1/2 tablet as needed   mirabegron ER (MYRBETRIQ) 50 MG TB24 tablet Take 50 mg by mouth daily.   naratriptan (AMERGE) 2.5 MG tablet Take 0.5 tablets by mouth daily as needed for migraine.   nitrofurantoin (MACRODANTIN) 50 MG capsule Take 50 mg by mouth at bedtime.   propranolol ER (INDERAL LA) 60 MG 24 hr capsule Take 60 mg by mouth daily.   rosuvastatin (CRESTOR) 10 MG tablet TAKE 1 TABLET BY MOUTH EVERY DAY   tretinoin (RETIN-A) 0.05 % cream USE 1 APPLICATION TOPICALLY QHS   Past Medical History:  Diagnosis Date   Anatomical narrow angle of both eyes 02/09/2013   Anxiety    Arthritis    Basal cell carcinoma of face    Breast cancer ([place]) 2016   Colitis - acute nonspecific at colonoscopy 08/28/2014   Conjunctival cyst of right eye 02/09/2013   Crohn's colitis - suspected 08/28/2014   Elevated coronary artery calcium  score 02/01/2020   Encounter for cardiac risk counseling 11/29/2018   Frequency of micturition 11/28/2012   Heart murmur    History of pregnancy 02/22/2013   Overview:  Menopause early 3s, on ERT since  Formatting of this note might be different from the original. Menopause early 50s, on ERT since   Hyperlipidemia    Insomnia 02/08/2013   Interstitial cystitis    Interstitial cystitis (chronic) without hematuria 11/28/2012   Kidney stones    Malignant neoplasm of right breast, stage 1 ([place]) 06/22/2015   Menopausal  vaginal dryness 05/20/2019   Menopause    Migraine headache    Nephrolithiasis 10/12/2013   Nuclear sclerotic cataract of both eyes 03/02/2013   Osteoporosis of forearm 09/22/2017   Palpitation 11/29/2018   Seasonal allergies    Tachycardia 11/29/2018   Urinary urgency 10/12/2012   Uterine fibroid 08/28/2013   Overview:  INcidental finding on TVUS in 08/2012 to work up pelvic pain.  3 cm posterior fibroid and 2 cm intramural central fibroid  Formatting of this note might be different from the original. INcidental finding on TVUS in 08/2012 to work up pelvic pain.  3 cm posterior fibroid and 2 cm intramural central fibroid   Past Surgical History:  Procedure Laterality Date   BREAST LUMPECTOMY Right    CESAREAN SECTION     x 2   COLONOSCOPY     TONSILLECTOMY     Social History   Social History Narrative   Married with 2 children   1 glass of wine on the weekends no smoking no drug use   family history includes Alzheimer's disease in her mother; Atrial fibrillation in her mother; Heart disease in her father; Hyperlipidemia in her brother, brother, and father; Multiple myeloma in her paternal grandmother.   Review of Systems As per HPI  Objective:   Physical Exam BP 116/64    Pulse 84    Ht $R'5\' 2"'Anderson$  (5.176 m)    Wt 141 lb (64 kg)    BMI 25.79 kg/m   Total time 22 minutes during and before plus after the in person portion of the visit

## 2022-01-14 DIAGNOSIS — Z923 Personal history of irradiation: Secondary | ICD-10-CM | POA: Diagnosis not present

## 2022-01-14 DIAGNOSIS — M810 Age-related osteoporosis without current pathological fracture: Secondary | ICD-10-CM | POA: Diagnosis not present

## 2022-01-14 DIAGNOSIS — M8589 Other specified disorders of bone density and structure, multiple sites: Secondary | ICD-10-CM | POA: Diagnosis not present

## 2022-01-14 DIAGNOSIS — Z170 Estrogen receptor positive status [ER+]: Secondary | ICD-10-CM | POA: Diagnosis not present

## 2022-01-14 DIAGNOSIS — Z853 Personal history of malignant neoplasm of breast: Secondary | ICD-10-CM | POA: Diagnosis not present

## 2022-01-14 DIAGNOSIS — M159 Polyosteoarthritis, unspecified: Secondary | ICD-10-CM | POA: Diagnosis not present

## 2022-01-14 DIAGNOSIS — Z08 Encounter for follow-up examination after completed treatment for malignant neoplasm: Secondary | ICD-10-CM | POA: Diagnosis not present

## 2022-01-14 DIAGNOSIS — C50911 Malignant neoplasm of unspecified site of right female breast: Secondary | ICD-10-CM | POA: Diagnosis not present

## 2022-01-14 DIAGNOSIS — G43909 Migraine, unspecified, not intractable, without status migrainosus: Secondary | ICD-10-CM | POA: Diagnosis not present

## 2022-01-14 DIAGNOSIS — R748 Abnormal levels of other serum enzymes: Secondary | ICD-10-CM | POA: Diagnosis not present

## 2022-01-14 DIAGNOSIS — Z8639 Personal history of other endocrine, nutritional and metabolic disease: Secondary | ICD-10-CM | POA: Diagnosis not present

## 2022-01-17 ENCOUNTER — Encounter: Payer: Self-pay | Admitting: Internal Medicine

## 2022-01-19 NOTE — Telephone Encounter (Signed)
Pt scheduled for a Colonoscopy on 3/17/ 2023 at 10:00 AM with Dr. Carlean Purl in the [facility]: Pt made aware. Pt scheduled for a telephone previsit appointment on 02/08/2022 at 3:00: Pt made aware:  Pt verbalized understanding with all questions answered.

## 2022-02-02 ENCOUNTER — Telehealth: Payer: Self-pay

## 2022-02-02 NOTE — Telephone Encounter (Signed)
Received message from another RN: "Repeat Alkaline phosphatase, the order is in epic"     Pt notified: Pt stated that she recently had them done at [facility]: Please review and Advise

## 2022-02-02 NOTE — Telephone Encounter (Signed)
[facility] and [facility]  Please disregard any further lab tests

## 2022-02-02 NOTE — Telephone Encounter (Signed)
Pt made aware of Dr. Gessner recommendations: Pt verbalized understanding with all questions answered.   

## 2022-02-08 ENCOUNTER — Ambulatory Visit (AMBULATORY_SURGERY_CENTER): Payer: Medicare PPO | Admitting: *Deleted

## 2022-02-08 ENCOUNTER — Other Ambulatory Visit: Payer: Self-pay

## 2022-02-08 DIAGNOSIS — K633 Ulcer of intestine: Secondary | ICD-10-CM

## 2022-02-08 DIAGNOSIS — R748 Abnormal levels of other serum enzymes: Secondary | ICD-10-CM

## 2022-02-08 NOTE — Progress Notes (Signed)

## 2022-02-12 DIAGNOSIS — M8589 Other specified disorders of bone density and structure, multiple sites: Secondary | ICD-10-CM | POA: Diagnosis not present

## 2022-02-12 DIAGNOSIS — Z7189 Other specified counseling: Secondary | ICD-10-CM | POA: Diagnosis not present

## 2022-02-15 DIAGNOSIS — R350 Frequency of micturition: Secondary | ICD-10-CM | POA: Diagnosis not present

## 2022-02-15 DIAGNOSIS — R3915 Urgency of urination: Secondary | ICD-10-CM | POA: Diagnosis not present

## 2022-02-15 DIAGNOSIS — N3010 Interstitial cystitis (chronic) without hematuria: Secondary | ICD-10-CM | POA: Diagnosis not present

## 2022-02-24 DIAGNOSIS — H5203 Hypermetropia, bilateral: Secondary | ICD-10-CM | POA: Diagnosis not present

## 2022-02-24 DIAGNOSIS — H2513 Age-related nuclear cataract, bilateral: Secondary | ICD-10-CM | POA: Diagnosis not present

## 2022-02-24 DIAGNOSIS — H43813 Vitreous degeneration, bilateral: Secondary | ICD-10-CM | POA: Diagnosis not present

## 2022-02-26 ENCOUNTER — Encounter: Payer: Self-pay | Admitting: Internal Medicine

## 2022-03-05 ENCOUNTER — Ambulatory Visit (AMBULATORY_SURGERY_CENTER): Payer: Medicare PPO | Admitting: Internal Medicine

## 2022-03-05 ENCOUNTER — Encounter: Payer: Self-pay | Admitting: Internal Medicine

## 2022-03-05 ENCOUNTER — Other Ambulatory Visit: Payer: Self-pay | Admitting: Internal Medicine

## 2022-03-05 VITALS — BP 128/56 | HR 60 | Temp 98.9°F | Resp 14 | Ht 62.0 in | Wt 140.0 lb

## 2022-03-05 DIAGNOSIS — D120 Benign neoplasm of cecum: Secondary | ICD-10-CM

## 2022-03-05 DIAGNOSIS — K6389 Other specified diseases of intestine: Secondary | ICD-10-CM | POA: Diagnosis not present

## 2022-03-05 DIAGNOSIS — K633 Ulcer of intestine: Secondary | ICD-10-CM | POA: Diagnosis not present

## 2022-03-05 DIAGNOSIS — R748 Abnormal levels of other serum enzymes: Secondary | ICD-10-CM

## 2022-03-05 DIAGNOSIS — F419 Anxiety disorder, unspecified: Secondary | ICD-10-CM | POA: Diagnosis not present

## 2022-03-05 DIAGNOSIS — K635 Polyp of colon: Secondary | ICD-10-CM

## 2022-03-05 MED ORDER — SODIUM CHLORIDE 0.9 % IV SOLN: 500.0000 mL | Freq: Once | INTRAVENOUS | Status: DC

## 2022-03-05 NOTE — Op Note (Addendum)
Jenna Li ?Patient Name: Jenna Li ?Procedure Date: 03/05/2022 10:25 AM ?MRN: [id number] ?Endoscopist: Gatha Mayer , MD ?Age: 66 ?Referring MD:  ?Date of Birth: 1955/12/28 ?Gender: Female ?Account #: [id number] ?Procedure:                Colonoscopy ?Indications:              Suspected Crohn's disease of the colon ?Medicines:                Monitored Anesthesia Care ?Procedure:                Pre-Anesthesia Assessment: ?                          - Prior to the procedure, a History and Physical  ?                          was performed, and patient medications and  ?                          allergies were reviewed. The patient's tolerance of  ?                          previous anesthesia was also reviewed. The risks  ?                          and benefits of the procedure and the sedation  ?                          options and risks were discussed with the patient.  ?                          All questions were answered, and informed consent  ?                          was obtained. Prior Anticoagulants: The patient has  ?                          taken no previous anticoagulant or antiplatelet  ?                          agents. ASA Grade Assessment: II - A patient with  ?                          mild systemic disease. After reviewing the risks  ?                          and benefits, the patient was deemed in  ?                          satisfactory condition to undergo the procedure. ?                          After obtaining informed consent, the colonoscope  ?  was passed under direct vision. Throughout the  ?                          procedure, the patient's blood pressure, pulse, and  ?                          oxygen saturations were monitored continuously. The  ?                          CF [id number] #[id number] was introduced through the anus  ?                          and advanced to the the terminal ileum, with  ?                          identification of the  appendiceal orifice and IC  ?                          valve. The colonoscopy was somewhat difficult due  ?                          to significant looping. Successful completion of  ?                          the procedure was aided by applying abdominal  ?                          pressure. The patient tolerated the procedure well.  ?                          The quality of the bowel preparation was good. The  ?                          terminal ileum, ileocecal valve, appendiceal  ?                          orifice, and rectum were photographed. The bowel  ?                          preparation used was Miralax via split dose  ?                          instruction. ?Scope In: 10:40:43 AM ?Scope Out: 11:01:17 AM ?Scope Withdrawal Time: 0 hours 11 minutes 53 seconds  ?Total Procedure Duration: 0 hours 20 minutes 34 seconds  ?Findings:                 The perianal and digital rectal examinations were  ?                          normal. ?                          Ulcerated mucosa were present in the cecum.  ?  Biopsies were taken with a cold forceps for  ?                          histology. Verification of patient identification  ?                          for the specimen was done. Estimated blood loss was  ?                          minimal. ?                          A diminutive polyp was found in the cecum. The  ?                          polyp was sessile. The polyp was removed with a  ?                          cold snare. Resection and retrieval were complete.  ?                          Verification of patient identification for the  ?                          specimen was done. Estimated blood loss was minimal. ?                          The terminal ileum appeared normal. Biopsies were  ?                          taken with a cold forceps for histology.  ?                          Verification of patient identification for the  ?                          specimen was done.  Estimated blood loss was minimal. ?                          The exam was otherwise without abnormality on  ?                          direct and retroflexion views. ?Complications:            No immediate complications. ?Estimated Blood Loss:     Estimated blood loss was minimal. ?Impression:               - Mucosal ulceration. Biopsied. ?                          - One diminutive polyp in the cecum, removed with a  ?                          cold snare. Resected and retrieved. ?                          -  The examined portion of the ileum was normal.  ?                          Biopsied. ?                          - The examination was otherwise normal on direct  ?                          and retroflexion views. ?Recommendation:           - Patient has a contact number available for  ?                          emergencies. The signs and symptoms of potential  ?                          delayed complications were discussed with the  ?                          patient. Return to normal activities tomorrow.  ?                          Written discharge instructions were provided to the  ?                          patient. ?                          - Resume previous diet. ?                          - Continue present medications. ?                          - Await pathology results. ?                          - No recommendation at this time regarding repeat  ?                          colonoscopy. ?                          - ? needs evaluation for PSC (MRCP?) ?Gatha Mayer, MD ?03/05/2022 11:14:42 AM ?This report has been signed electronically. ?

## 2022-03-05 NOTE — Progress Notes (Signed)
To Pacu, VSS. Report to [email or web address].tb 

## 2022-03-05 NOTE — Progress Notes (Signed)
Pt's states no medical or surgical changes since previsit or office visit.  ° °VS DT °

## 2022-03-05 NOTE — Patient Instructions (Addendum)
So there is some inflammation in the colon at the beginning. I took biopsies and removed a polyp that looks inflammatory. ? ?I will contact you with results and recommendations. ? ?It is possible this explains the abnormal alkaline phosphatase. ? ?I appreciate the opportunity to care for you. ?Gatha Mayer, MD, [facility] ? ?Please read handouts provided. ?Continue present medications. ?Await pathology results. ? ? ? ?YOU HAD AN ENDOSCOPIC PROCEDURE TODAY AT [facility]:   Refer to the procedure report that was given to you for any specific questions about what was found during the examination.  If the procedure report does not answer your questions, please call your gastroenterologist to clarify.  If you requested that your care partner not be given the details of your procedure findings, then the procedure report has been included in a sealed envelope for you to review at your convenience later. ? ?YOU SHOULD EXPECT: Some feelings of bloating in the abdomen. Passage of more gas than usual.  Walking can help get rid of the air that was put into your GI tract during the procedure and reduce the bloating. If you had a lower endoscopy (such as a colonoscopy or flexible sigmoidoscopy) you may notice spotting of blood in your stool or on the toilet paper. If you underwent a bowel prep for your procedure, you may not have a normal bowel movement for a few days. ? ?Please Note:  You might notice some irritation and congestion in your nose or some drainage.  This is from the oxygen used during your procedure.  There is no need for concern and it should clear up in a day or so. ? ?SYMPTOMS TO REPORT IMMEDIATELY: ? ?Following lower endoscopy (colonoscopy or flexible sigmoidoscopy): ? Excessive amounts of blood in the stool ? Significant tenderness or worsening of abdominal pains ? Swelling of the abdomen that is new, acute ? Fever of 100?F or higher ? ? ?For urgent or emergent issues, a gastroenterologist can  be reached at any hour by calling [phone]. ?Do not use MyChart messaging for urgent concerns.  ? ? ?DIET:  We do recommend a small meal at first, but then you may proceed to your regular diet.  Drink plenty of fluids but you should avoid alcoholic beverages for 24 hours. ? ?ACTIVITY:  You should plan to take it easy for the rest of today and you should NOT DRIVE or use heavy machinery until tomorrow (because of the sedation medicines used during the test).   ? ?FOLLOW UP: ?Our staff will call the number listed on your records 48-72 hours following your procedure to check on you and address any questions or concerns that you may have regarding the information given to you following your procedure. If we do not reach you, we will leave a message.  We will attempt to reach you two times.  During this call, we will ask if you have developed any symptoms of COVID 19. If you develop any symptoms (ie: fever, flu-like symptoms, shortness of breath, cough etc.) before then, please call [phone].  If you test positive for Covid 19 in the 2 weeks post procedure, please call and report this information to [place].   ? ?If any biopsies were taken you will be contacted by phone or by letter within the next 1-3 weeks.  Please call us at [phone] if you have not heard about the biopsies in 3 weeks.  ? ? ?SIGNATURES/CONFIDENTIALITY: ?You and/or your care partner have signed  paperwork which will be entered into your electronic medical record.  These signatures attest to the fact that that the information above on your After Visit Summary has been reviewed and is understood.  Full responsibility of the confidentiality of this discharge information lies with you and/or your care-partner.  ?

## 2022-03-05 NOTE — Progress Notes (Signed)
[facility] History and Physical ? ? ?Primary Care Physician:  Harlan Stains, MD ? ? ?Reason for Procedure:   Elevated alkaline phosphatase and ulceration of intestine ? ?Plan:    colonoscopy ? ? ? ? ?HPI: Jenna Li is a 66 y.o. female w/ hx of colon ulcers in past and now w/ unexplained alk phos elevation. ? IBD ? ? ?Past Medical History:  ?Diagnosis Date  ? Anatomical narrow angle of both eyes 02/09/2013  ? Anxiety   ? Arthritis   ? Basal cell carcinoma of face   ? Breast cancer ([place]) 2016  ? Colitis - acute nonspecific at colonoscopy 08/28/2014  ? Conjunctival cyst of right eye 02/09/2013  ? Crohn's colitis - suspected 08/28/2014  ? Elevated coronary artery calcium score 02/01/2020  ? Encounter for cardiac risk counseling 11/29/2018  ? Frequency of micturition 11/28/2012  ? Heart murmur   ? History of pregnancy 02/22/2013  ? Overview:  Menopause early 12s, on ERT since  Formatting of this note might be different from the original. Menopause early 58s, on ERT since  ? Hyperlipidemia   ? Insomnia 02/08/2013  ? Interstitial cystitis   ? Interstitial cystitis (chronic) without hematuria 11/28/2012  ? Kidney stones   ? Malignant neoplasm of right breast, stage 1 ([place]) 06/22/2015  ? Menopausal vaginal dryness 05/20/2019  ? Menopause   ? Migraine headache   ? Nephrolithiasis 10/12/2013  ? Nuclear sclerotic cataract of both eyes 03/02/2013  ? Osteoporosis of forearm 09/22/2017  ? Palpitation 11/29/2018  ? Seasonal allergies   ? Tachycardia 11/29/2018  ? Urinary urgency 10/12/2012  ? Uterine fibroid 08/28/2013  ? Overview:  INcidental finding on TVUS in 08/2012 to work up pelvic pain.  3 cm posterior fibroid and 2 cm intramural central fibroid  Formatting of this note might be different from the original. INcidental finding on TVUS in 08/2012 to work up pelvic pain.  3 cm posterior fibroid and 2 cm intramural central fibroid  ? ? ?Past Surgical History:  ?Procedure Laterality Date  ? BREAST LUMPECTOMY  Right   ? CESAREAN SECTION    ? x 2  ? COLONOSCOPY    ? TONSILLECTOMY    ? ? ?Prior to Admission medications   ?Medication Sig Start Date End Date Taking? Authorizing Provider  ?eszopiclone (LUNESTA) 2 MG TABS Take 2 mg by mouth at bedtime. Take immediately before bedtime - takes 1/2 tablet as needed   Yes [provider]  ?loratadine (CLARITIN) 10 MG tablet Take 10 mg by mouth daily.   Yes [provider]  ?mirabegron ER (MYRBETRIQ) 50 MG TB24 tablet Take 50 mg by mouth daily.   Yes [provider]  ?naratriptan (AMERGE) 2.5 MG tablet Take 0.5 tablets by mouth daily as needed for migraine. 03/10/21 03/10/22 Yes [provider]  ?propranolol ER (INDERAL LA) 60 MG 24 hr capsule Take 60 mg by mouth daily. 11/02/18  Yes [provider]  ?rosuvastatin (CRESTOR) 10 MG tablet TAKE 1 TABLET BY MOUTH EVERY DAY 10/12/21  Yes Richardo Priest, MD  ?aspirin EC 81 MG tablet Take 1 tablet (81 mg total) by mouth daily. ?Patient not taking: Reported on 02/08/2022 02/01/20   Richardo Priest, MD  ?celecoxib (CELEBREX) 200 MG capsule Take 200 mg by mouth daily. 02/08/20   [provider]  ?Eduard Roux (AIMOVIG) 140 MG/ML SOAJ Inject 140 mg into the skin every 30 (thirty) days. ?Patient not taking: Reported on 02/08/2022 03/12/21 03/12/22  [provider]  ?  nitrofurantoin (MACRODANTIN) 50 MG capsule Take 50 mg by mouth at bedtime. 12/16/19   [provider]  ?tretinoin (RETIN-A) 0.05 % cream USE 1 APPLICATION TOPICALLY QHS ?Patient not taking: Reported on 02/08/2022 01/01/19   [provider]  ? ? ?Current Outpatient Medications  ?Medication Sig Dispense Refill  ? eszopiclone (LUNESTA) 2 MG TABS Take 2 mg by mouth at bedtime. Take immediately before bedtime - takes 1/2 tablet as needed    ? loratadine (CLARITIN) 10 MG tablet Take 10 mg by mouth daily.    ? mirabegron ER (MYRBETRIQ) 50 MG TB24 tablet Take 50 mg by mouth daily.    ? naratriptan (AMERGE) 2.5 MG  tablet Take 0.5 tablets by mouth daily as needed for migraine.    ? propranolol ER (INDERAL LA) 60 MG 24 hr capsule Take 60 mg by mouth daily.  1  ? rosuvastatin (CRESTOR) 10 MG tablet TAKE 1 TABLET BY MOUTH EVERY DAY 90 tablet 2  ? aspirin EC 81 MG tablet Take 1 tablet (81 mg total) by mouth daily. (Patient not taking: Reported on 02/08/2022) 90 tablet 3  ? celecoxib (CELEBREX) 200 MG capsule Take 200 mg by mouth daily.    ? Erenumab-aooe (AIMOVIG) 140 MG/ML SOAJ Inject 140 mg into the skin every 30 (thirty) days. (Patient not taking: Reported on 02/08/2022)    ? nitrofurantoin (MACRODANTIN) 50 MG capsule Take 50 mg by mouth at bedtime.    ? tretinoin (RETIN-A) 0.05 % cream USE 1 APPLICATION TOPICALLY QHS (Patient not taking: Reported on 02/08/2022)    ? ?Current Facility-Administered Medications  ?Medication Dose Route Frequency Provider Last Rate Last Admin  ? 0.9 %  sodium chloride infusion  500 mL Intravenous Once Gatha Mayer, MD      ? ? ?Allergies as of 03/05/2022  ? (No Known Allergies)  ? ? ?Family History  ?Problem Relation Age of Onset  ? Atrial fibrillation Mother   ? Alzheimer's disease Mother   ? Heart disease Father   ? Hyperlipidemia Father   ? Hyperlipidemia Brother   ? Hyperlipidemia Brother   ? Multiple myeloma Paternal Grandmother   ? Colon cancer Neg Hx   ? Esophageal cancer Neg Hx   ? Stomach cancer Neg Hx   ? Kidney disease Neg Hx   ? Liver disease Neg Hx   ? Diabetes Neg Hx   ? ? ?Social History  ? ?Socioeconomic History  ? Marital status: Married  ?  Spouse name: Not on file  ? Number of children: 2  ? Years of education: Not on file  ? Highest education level: Not on file  ?Occupational History  ? Occupation: retired [employer] information centre manager  ?Tobacco Use  ? Smoking status: Never  ? Smokeless tobacco: Never  ?Vaping Use  ? Vaping Use: Never used  ?Substance and Sexual Activity  ? Alcohol use: Yes  ?  Alcohol/week: 1.0 standard drink  ?  Types: 1 Glasses of wine per week  ?  Comment: 1 glass of wine on  weekends  ? Drug use: No  ? Sexual activity: Yes  ?  Birth control/protection: Post-menopausal  ?Other Topics Concern  ? Not on file  ?Social History Narrative  ? Married with 2 children  ? 1 glass of wine on the weekends no smoking no drug use  ? ? ? ? ?Review of Systems: ? ?All other review of systems negative except as mentioned in the HPI. ? ?Physical Exam: ?Vital signs ?BP 127/66   Pulse 72  Temp 98.9 ?F (37.2 ?C) (Temporal)   Resp 15   Ht _0  (1.575 m)   Wt 140 lb (63.5 kg)   SpO2 97%   BMI 25.61 kg/m?  ? ?General:   Alert,  Well-developed, well-nourished, pleasant and cooperative in NAD ?Lungs:  Clear throughout to auscultation.   ?Heart:  Regular rate and rhythm; no murmurs, clicks, rubs,  or gallops. ?Abdomen:  Soft, nontender and nondistended. Normal bowel sounds.   ?Neuro/Psych:  Alert and cooperative. Normal mood and affect. A and O x 3 ? ? ?_1  E. Carlean Purl, MD, [facility] ?[facility] ?[phone] (pager) ?03/05/2022 10:34 AM@ ? ?

## 2022-03-05 NOTE — Progress Notes (Signed)
Called to room to assist during endoscopic procedure.  Patient ID and intended procedure confirmed with present staff. Received instructions for my participation in the procedure from the performing physician.  

## 2022-03-08 DIAGNOSIS — M810 Age-related osteoporosis without current pathological fracture: Secondary | ICD-10-CM | POA: Diagnosis not present

## 2022-03-09 ENCOUNTER — Telehealth: Payer: Self-pay

## 2022-03-09 DIAGNOSIS — N644 Mastodynia: Secondary | ICD-10-CM | POA: Diagnosis not present

## 2022-03-09 DIAGNOSIS — Z853 Personal history of malignant neoplasm of breast: Secondary | ICD-10-CM | POA: Diagnosis not present

## 2022-03-09 DIAGNOSIS — Z1231 Encounter for screening mammogram for malignant neoplasm of breast: Secondary | ICD-10-CM | POA: Diagnosis not present

## 2022-03-09 NOTE — Telephone Encounter (Signed)
?  Follow up Call- ? ?Call back number 03/05/2022  ?Post procedure Call Back phone  # [phone]  ?Permission to leave phone message Yes  ?Some recent data might be hidden  ?  ? ?Patient questions: ? ?Do you have a fever, pain , or abdominal swelling? No. ?Pain Score  0 * ? ?Have you tolerated food without any problems? Yes.   ? ?Have you been able to return to your normal activities? Yes.   ? ?Do you have any questions about your discharge instructions: ?Diet   No. ?Medications  No. ?Follow up visit  No. ? ?Do you have questions or concerns about your Care? No. ? ?Actions: ?* If pain score is 4 or above: ?No action needed, pain <4. ? ?Have you developed a fever since your procedure? no ? ?2.   Have you had an respiratory symptoms (SOB or cough) since your procedure? no ? ?3.   Have you tested positive for COVID 19 since your procedure no ? ?4.   Have you had any family members/close contacts diagnosed with the COVID 19 since your procedure?  no ? ? ?If yes to any of these questions please route to Joylene John, RN and Joella Prince, RN  ? ? ?

## 2022-03-15 ENCOUNTER — Other Ambulatory Visit: Payer: Self-pay | Admitting: Internal Medicine

## 2022-03-15 DIAGNOSIS — R748 Abnormal levels of other serum enzymes: Secondary | ICD-10-CM

## 2022-03-16 DIAGNOSIS — G43709 Chronic migraine without aura, not intractable, without status migrainosus: Secondary | ICD-10-CM | POA: Diagnosis not present

## 2022-03-16 DIAGNOSIS — Z79899 Other long term (current) drug therapy: Secondary | ICD-10-CM | POA: Diagnosis not present

## 2022-04-27 ENCOUNTER — Encounter: Payer: Self-pay | Admitting: Internal Medicine

## 2022-04-28 DIAGNOSIS — D2262 Melanocytic nevi of left upper limb, including shoulder: Secondary | ICD-10-CM | POA: Diagnosis not present

## 2022-04-28 DIAGNOSIS — L570 Actinic keratosis: Secondary | ICD-10-CM | POA: Diagnosis not present

## 2022-04-28 DIAGNOSIS — L578 Other skin changes due to chronic exposure to nonionizing radiation: Secondary | ICD-10-CM | POA: Diagnosis not present

## 2022-04-28 DIAGNOSIS — D225 Melanocytic nevi of trunk: Secondary | ICD-10-CM | POA: Diagnosis not present

## 2022-04-28 DIAGNOSIS — D2261 Melanocytic nevi of right upper limb, including shoulder: Secondary | ICD-10-CM | POA: Diagnosis not present

## 2022-04-28 DIAGNOSIS — L821 Other seborrheic keratosis: Secondary | ICD-10-CM | POA: Diagnosis not present

## 2022-04-28 DIAGNOSIS — Z85828 Personal history of other malignant neoplasm of skin: Secondary | ICD-10-CM | POA: Diagnosis not present

## 2022-05-07 DIAGNOSIS — Z9889 Other specified postprocedural states: Secondary | ICD-10-CM | POA: Diagnosis not present

## 2022-05-07 DIAGNOSIS — Z853 Personal history of malignant neoplasm of breast: Secondary | ICD-10-CM | POA: Diagnosis not present

## 2022-05-07 DIAGNOSIS — R928 Other abnormal and inconclusive findings on diagnostic imaging of breast: Secondary | ICD-10-CM | POA: Diagnosis not present

## 2022-05-07 DIAGNOSIS — N644 Mastodynia: Secondary | ICD-10-CM | POA: Diagnosis not present

## 2022-05-07 DIAGNOSIS — Z923 Personal history of irradiation: Secondary | ICD-10-CM | POA: Diagnosis not present

## 2022-05-10 ENCOUNTER — Other Ambulatory Visit: Payer: Self-pay | Admitting: Internal Medicine

## 2022-05-10 DIAGNOSIS — R748 Abnormal levels of other serum enzymes: Secondary | ICD-10-CM

## 2022-05-31 ENCOUNTER — Other Ambulatory Visit (INDEPENDENT_AMBULATORY_CARE_PROVIDER_SITE_OTHER): Payer: Medicare PPO

## 2022-05-31 DIAGNOSIS — R748 Abnormal levels of other serum enzymes: Secondary | ICD-10-CM | POA: Diagnosis not present

## 2022-05-31 LAB — HEPATIC FUNCTION PANEL
ALT: 7 U/L (ref 0–35)
AST: 20 U/L (ref 0–37)
Albumin: 4.8 g/dL (ref 3.5–5.2)
Alkaline Phosphatase: 97 U/L (ref 39–117)
Bilirubin, Direct: 0.1 mg/dL (ref 0.0–0.3)
Total Bilirubin: 0.9 mg/dL (ref 0.2–1.2)
Total Protein: 7.3 g/dL (ref 6.0–8.3)

## 2022-06-04 LAB — PTH, INTACT AND CALCIUM
Calcium: 10.3 mg/dL (ref 8.6–10.4)
PTH: 28 pg/mL (ref 16–77)

## 2022-06-06 ENCOUNTER — Encounter: Payer: Self-pay | Admitting: Internal Medicine

## 2022-06-06 ENCOUNTER — Other Ambulatory Visit: Payer: Self-pay | Admitting: Internal Medicine

## 2022-06-06 DIAGNOSIS — R748 Abnormal levels of other serum enzymes: Secondary | ICD-10-CM

## 2022-06-17 DIAGNOSIS — M810 Age-related osteoporosis without current pathological fracture: Secondary | ICD-10-CM | POA: Diagnosis not present

## 2022-06-17 DIAGNOSIS — Z08 Encounter for follow-up examination after completed treatment for malignant neoplasm: Secondary | ICD-10-CM | POA: Diagnosis not present

## 2022-06-17 DIAGNOSIS — C50911 Malignant neoplasm of unspecified site of right female breast: Secondary | ICD-10-CM | POA: Diagnosis not present

## 2022-06-17 DIAGNOSIS — Z170 Estrogen receptor positive status [ER+]: Secondary | ICD-10-CM | POA: Diagnosis not present

## 2022-06-17 DIAGNOSIS — R748 Abnormal levels of other serum enzymes: Secondary | ICD-10-CM | POA: Diagnosis not present

## 2022-06-17 DIAGNOSIS — Z923 Personal history of irradiation: Secondary | ICD-10-CM | POA: Diagnosis not present

## 2022-06-17 DIAGNOSIS — G43909 Migraine, unspecified, not intractable, without status migrainosus: Secondary | ICD-10-CM | POA: Diagnosis not present

## 2022-06-17 DIAGNOSIS — Z853 Personal history of malignant neoplasm of breast: Secondary | ICD-10-CM | POA: Diagnosis not present

## 2022-06-17 DIAGNOSIS — M47812 Spondylosis without myelopathy or radiculopathy, cervical region: Secondary | ICD-10-CM | POA: Diagnosis not present

## 2022-06-17 DIAGNOSIS — M8589 Other specified disorders of bone density and structure, multiple sites: Secondary | ICD-10-CM | POA: Diagnosis not present

## 2022-06-28 DIAGNOSIS — R3915 Urgency of urination: Secondary | ICD-10-CM | POA: Diagnosis not present

## 2022-07-01 ENCOUNTER — Other Ambulatory Visit: Payer: Self-pay | Admitting: Cardiology

## 2022-07-07 DIAGNOSIS — N3000 Acute cystitis without hematuria: Secondary | ICD-10-CM | POA: Diagnosis not present

## 2022-07-07 DIAGNOSIS — R300 Dysuria: Secondary | ICD-10-CM | POA: Diagnosis not present

## 2022-07-19 DIAGNOSIS — U071 COVID-19: Secondary | ICD-10-CM | POA: Diagnosis not present

## 2022-07-27 ENCOUNTER — Other Ambulatory Visit: Payer: Self-pay | Admitting: Cardiology

## 2022-08-06 ENCOUNTER — Other Ambulatory Visit: Payer: Self-pay | Admitting: Cardiology

## 2022-08-19 ENCOUNTER — Other Ambulatory Visit: Payer: Self-pay

## 2022-08-19 MED ORDER — ROSUVASTATIN CALCIUM 10 MG PO TABS: 10.0000 mg | ORAL_TABLET | Freq: Every day | ORAL | 0 refills | Status: DC

## 2022-08-24 DIAGNOSIS — N3001 Acute cystitis with hematuria: Secondary | ICD-10-CM | POA: Diagnosis not present

## 2022-08-24 DIAGNOSIS — R350 Frequency of micturition: Secondary | ICD-10-CM | POA: Diagnosis not present

## 2022-08-29 NOTE — Progress Notes (Unsigned)
Cardiology Office Note:    Date:  08/30/2022   ID:  Jenna Li, [place] [birth year], MRN [id number]  PCP:  Harlan Stains, MD  Cardiologist:  Shirlee More, MD    Referring MD: Harlan Stains, MD    ASSESSMENT:    1. Elevated lipoprotein(a)   2. Agatston coronary artery calcium score less than 100   3. Thoracic aorta atherosclerosis ([place])    PLAN:    In order of problems listed above:  She is at higher risk surviving breast cancer hypertension elevated LP(a) on a statin we will recheck a lipid profile and LP(a) goal LDL less than 70.  She may require higher dose of rosuvastatin Retentions controlled continue her beta-blocker   Next appointment: 1 year   Medication Adjustments/Labs and Tests Ordered: Current medicines are reviewed at length with the patient today.  Concerns regarding medicines are outlined above.  No orders of the defined types were placed in this encounter.  No orders of the defined types were placed in this encounter.   Chief complaint follow-up for lipid disorder elevated LP(a)   History of Present Illness:    Jenna Li is a 66 y.o. female with a hx of breast cancer and dyslipidemia with elevated LP(a) and a coronary calcium score 1.6/58th percentile last seen 08/19/2021.  Compliance with diet, lifestyle and medications: Yes  Unfortunately she had COVID this summer related to travel to [place] quickly resolved with Paxlovid Pressure has been well controlled she takes a statin without muscle pain or weakness She has had no cardiovascular symptoms of edema shortness of breath chest pain palpitation or syncope Past Medical History:  Diagnosis Date   Anatomical narrow angle of both eyes 02/09/2013   Anxiety    Arthritis    Basal cell carcinoma of face    Breast cancer ([place]) 2016   Colitis - acute nonspecific at colonoscopy 08/28/2014   Conjunctival cyst of right eye 02/09/2013   Crohn's colitis - suspected 08/28/2014   Elevated coronary  artery calcium score 02/01/2020   Encounter for cardiac risk counseling 11/29/2018   Frequency of micturition 11/28/2012   Heart murmur    History of pregnancy 02/22/2013   Overview:  Menopause early 67s, on ERT since  Formatting of this note might be different from the original. Menopause early 50s, on ERT since   Hyperlipidemia    Insomnia 02/08/2013   Interstitial cystitis    Interstitial cystitis (chronic) without hematuria 11/28/2012   Kidney stones    Malignant neoplasm of right breast, stage 1 ([place]) 06/22/2015   Menopausal vaginal dryness 05/20/2019   Menopause    Migraine headache    Nephrolithiasis 10/12/2013   Nuclear sclerotic cataract of both eyes 03/02/2013   Osteoporosis of forearm 09/22/2017   Palpitation 11/29/2018   Seasonal allergies    Tachycardia 11/29/2018   Urinary urgency 10/12/2012   Uterine fibroid 08/28/2013   Overview:  INcidental finding on TVUS in 08/2012 to work up pelvic pain.  3 cm posterior fibroid and 2 cm intramural central fibroid  Formatting of this note might be different from the original. INcidental finding on TVUS in 08/2012 to work up pelvic pain.  3 cm posterior fibroid and 2 cm intramural central fibroid    Past Surgical History:  Procedure Laterality Date   BREAST LUMPECTOMY Right    CESAREAN SECTION     x 2   COLONOSCOPY     TONSILLECTOMY      Current Medications: Current Meds  Medication Sig  AIMOVIG 140 MG/ML SOAJ Inject 140 mg as directed every 30 (thirty) days.   aspirin EC 81 MG tablet Take 1 tablet (81 mg total) by mouth daily.   celecoxib (CELEBREX) 200 MG capsule Take 200 mg by mouth daily as needed for mild pain.   loratadine (CLARITIN) 10 MG tablet Take 10 mg by mouth daily.   mirabegron ER (MYRBETRIQ) 50 MG TB24 tablet Take 50 mg by mouth daily.   naratriptan (AMERGE) 2.5 MG tablet Take 0.5 tablets by mouth daily as needed for migraine.   nitrofurantoin (MACRODANTIN) 50 MG capsule Take 50 mg by mouth at bedtime as  needed (UTI).   propranolol ER (INDERAL LA) 60 MG 24 hr capsule Take 60 mg by mouth daily.   rosuvastatin (CRESTOR) 10 MG tablet Take 1 tablet (10 mg total) by mouth daily. Patient needs an appointment for further refills. / Must keep 08/30/22  appt final attempt   tretinoin (RETIN-A) 0.05 % cream      Allergies:   Patient has no known allergies.   Social History   Socioeconomic History   Marital status: Married    Spouse name: Not on file   Number of children: 2   Years of education: Not on file   Highest education level: Not on file  Occupational History   Occupation: retired Tourist information centre manager  Tobacco Use   Smoking status: Never   Smokeless tobacco: Never  Vaping Use   Vaping Use: Never used  Substance and Sexual Activity   Alcohol use: Yes    Alcohol/week: 1.0 standard drink of alcohol    Types: 1 Glasses of wine per week    Comment: 1 glass of wine on weekends   Drug use: No   Sexual activity: Yes    Birth control/protection: Post-menopausal  Other Topics Concern   Not on file  Social History Narrative   Married with 2 children   1 glass of wine on the weekends no smoking no drug use   Social Determinants of Radio broadcast assistant Strain: Not on file  Food Insecurity: Not on file  Transportation Needs: Not on file  Physical Activity: Not on file  Stress: Not on file  Social Connections: Not on file     Family History: The patient's family history includes Alzheimer's disease in her mother; Atrial fibrillation in her mother; Heart disease in her father; Hyperlipidemia in her brother, brother, and father; Multiple myeloma in her paternal grandmother. There is no history of Colon cancer, Esophageal cancer, Stomach cancer, Kidney disease, Liver disease, or Diabetes. ROS:   Please see the history of present illness.    All other systems reviewed and are negative.  EKGs/Labs/Other Studies Reviewed:    The following studies were reviewed today:  EKG:  EKG ordered  today and personally reviewed.  The ekg ordered today demonstrates sinus rhythm and is normal  Recent Labs: 05/31/2022: ALT 7 CMP normal with the exception of the random glucose of 103 Recent Lipid Panel    Component Value Date/Time   CHOL 176 12/01/2020 1459   TRIG 269 (H) 12/01/2020 1459   HDL 46 12/01/2020 1459   CHOLHDL 3.8 12/01/2020 1459   LDLCALC 86 12/01/2020 1459    Physical Exam:    VS:  BP 112/74 (BP Location: Left Arm, Patient Position: Sitting)   Pulse (!) 55   Ht _0  (1.575 m)   Wt 141 lb (64 kg)   SpO2 98%   BMI 25.79 kg/m  Wt Readings from Last 3 Encounters:  08/30/22 141 lb (64 kg)  03/05/22 140 lb (63.5 kg)  12/22/21 141 lb (64 kg)     GEN:  Well nourished, well developed in no acute distress HEENT: Normal NECK: No JVD; No carotid bruits LYMPHATICS: No lymphadenopathy CARDIAC: RRR, no murmurs, rubs, gallops RESPIRATORY:  Clear to auscultation without rales, wheezing or rhonchi  ABDOMEN: Soft, non-tender, non-distended MUSCULOSKELETAL:  No edema; No deformity  SKIN: Warm and dry NEUROLOGIC:  Alert and oriented x 3 PSYCHIATRIC:  Normal affect    Signed, Shirlee More, MD  08/30/2022 8:42 AM    [place]

## 2022-08-30 ENCOUNTER — Ambulatory Visit: Payer: Medicare PPO | Attending: Cardiology | Admitting: Cardiology

## 2022-08-30 ENCOUNTER — Encounter: Payer: Self-pay | Admitting: Cardiology

## 2022-08-30 VITALS — BP 112/74 | HR 55 | Ht 62.0 in | Wt 141.0 lb

## 2022-08-30 DIAGNOSIS — E7841 Elevated Lipoprotein(a): Secondary | ICD-10-CM | POA: Diagnosis not present

## 2022-08-30 DIAGNOSIS — R931 Abnormal findings on diagnostic imaging of heart and coronary circulation: Secondary | ICD-10-CM | POA: Diagnosis not present

## 2022-08-30 DIAGNOSIS — I700 Atherosclerosis of aorta: Secondary | ICD-10-CM

## 2022-08-30 NOTE — Patient Instructions (Signed)
Medication Instructions:  Your physician recommends that you continue on your current medications as directed. Please refer to the Current Medication list given to you today.  *If you need a refill on your cardiac medications before your next appointment, please call your pharmacy*   Lab Work: Your physician recommends that you return for lab work in:   Labs today in [place] [address]: Lipids, LPa  If you have labs (blood work) drawn today and your tests are completely normal, you will receive your results only by: MyChart Message (if you have [place]) OR A paper copy in the mail If you have any lab test that is abnormal or we need to change your treatment, we will call you to review the results.   Testing/Procedures: None   Follow-Up: At [facility], you and your health needs are our priority.  As part of our continuing mission to provide you with exceptional heart care, we have created designated Provider Care Teams.  These Care Teams include your primary Cardiologist (physician) and Advanced Practice Providers (APPs -  Physician Assistants and Nurse Practitioners) who all work together to provide you with the care you need, when you need it.  We recommend signing up for the patient portal called "MyChart".  Sign up information is provided on this After Visit Summary.  MyChart is used to connect with patients for Virtual Visits (Telemedicine).  Patients are able to view lab/test results, encounter notes, upcoming appointments, etc.  Non-urgent messages can be sent to your provider as well.   To learn more about what you can do with MyChart, go to [email or web address].    Your next appointment:   1 year(s)  The format for your next appointment:   In Person  Provider:   Buford Dresser, MD    Other Instructions None  Important Information About Sugar

## 2022-08-31 LAB — LIPID PANEL
Chol/HDL Ratio: 2.8 ratio (ref 0.0–4.4)
Cholesterol, Total: 166 mg/dL (ref 100–199)
HDL: 60 mg/dL (ref >39)
LDL Chol Calc (NIH): 91 mg/dL (ref 0–99)
Triglycerides: 80 mg/dL (ref 0–149)
VLDL Cholesterol Cal: 15 mg/dL (ref 5–40)

## 2022-08-31 LAB — LIPOPROTEIN A (LPA): Lipoprotein (a): 209.1 nmol/L — ABNORMAL HIGH (ref <75.0)

## 2022-09-16 ENCOUNTER — Other Ambulatory Visit: Payer: Self-pay | Admitting: Cardiology

## 2022-09-28 ENCOUNTER — Telehealth: Payer: Self-pay

## 2022-09-28 DIAGNOSIS — I700 Atherosclerosis of aorta: Secondary | ICD-10-CM

## 2022-09-28 NOTE — Telephone Encounter (Signed)
LM to return my call via my chart

## 2022-09-28 NOTE — Telephone Encounter (Signed)
-----   Message from Tyler Pita, RN sent at 08/31/2022  8:17 AM EDT -----  ----- Message ----- From: Richardo Priest, MD Sent: 08/31/2022   7:56 AM EDT To: Rebeca Alert Ash/Hp Triage  Her LDL is above target and her case with the elevated LP(a) would like to see this level in the range of 55 or less and I would like her to start Zetia 10 mg along with her rosuvastatin and have a follow-up lipid profile performed in about 8 weeks.

## 2022-10-11 MED ORDER — EZETIMIBE 10 MG PO TABS: 10.0000 mg | ORAL_TABLET | Freq: Every day | ORAL | 2 refills | Status: DC

## 2022-10-11 NOTE — Addendum Note (Signed)
Addended by: Darrel Reach on: 10/11/2022 05:22 PM   Modules accepted: Orders

## 2022-10-11 NOTE — Telephone Encounter (Signed)
Patient notified of the following per BJM, agreed with plan. Med sent lab order on file, aware to fast for labs

## 2022-10-11 NOTE — Telephone Encounter (Signed)
Pt is returning call and is requesting call back.  

## 2022-10-21 DIAGNOSIS — Z853 Personal history of malignant neoplasm of breast: Secondary | ICD-10-CM | POA: Diagnosis not present

## 2022-10-21 DIAGNOSIS — Z170 Estrogen receptor positive status [ER+]: Secondary | ICD-10-CM | POA: Diagnosis not present

## 2022-10-21 DIAGNOSIS — C50911 Malignant neoplasm of unspecified site of right female breast: Secondary | ICD-10-CM | POA: Diagnosis not present

## 2022-10-21 DIAGNOSIS — R928 Other abnormal and inconclusive findings on diagnostic imaging of breast: Secondary | ICD-10-CM | POA: Diagnosis not present

## 2022-10-27 DIAGNOSIS — Z79899 Other long term (current) drug therapy: Secondary | ICD-10-CM | POA: Diagnosis not present

## 2022-10-27 DIAGNOSIS — G43709 Chronic migraine without aura, not intractable, without status migrainosus: Secondary | ICD-10-CM | POA: Diagnosis not present

## 2022-10-28 DIAGNOSIS — F419 Anxiety disorder, unspecified: Secondary | ICD-10-CM | POA: Diagnosis not present

## 2022-10-28 DIAGNOSIS — F325 Major depressive disorder, single episode, in full remission: Secondary | ICD-10-CM | POA: Diagnosis not present

## 2022-10-28 DIAGNOSIS — Z23 Encounter for immunization: Secondary | ICD-10-CM | POA: Diagnosis not present

## 2022-10-28 DIAGNOSIS — G43009 Migraine without aura, not intractable, without status migrainosus: Secondary | ICD-10-CM | POA: Diagnosis not present

## 2022-10-28 DIAGNOSIS — Z0000 Encounter for general adult medical examination without abnormal findings: Secondary | ICD-10-CM | POA: Diagnosis not present

## 2022-10-28 DIAGNOSIS — E785 Hyperlipidemia, unspecified: Secondary | ICD-10-CM | POA: Diagnosis not present

## 2022-11-03 DIAGNOSIS — E663 Overweight: Secondary | ICD-10-CM | POA: Diagnosis not present

## 2022-11-03 DIAGNOSIS — L578 Other skin changes due to chronic exposure to nonionizing radiation: Secondary | ICD-10-CM | POA: Diagnosis not present

## 2022-11-03 DIAGNOSIS — L820 Inflamed seborrheic keratosis: Secondary | ICD-10-CM | POA: Diagnosis not present

## 2022-11-03 DIAGNOSIS — B351 Tinea unguium: Secondary | ICD-10-CM | POA: Diagnosis not present

## 2022-11-03 DIAGNOSIS — Z85828 Personal history of other malignant neoplasm of skin: Secondary | ICD-10-CM | POA: Diagnosis not present

## 2022-11-03 DIAGNOSIS — L814 Other melanin hyperpigmentation: Secondary | ICD-10-CM | POA: Diagnosis not present

## 2022-11-03 DIAGNOSIS — L821 Other seborrheic keratosis: Secondary | ICD-10-CM | POA: Diagnosis not present

## 2022-11-03 DIAGNOSIS — L570 Actinic keratosis: Secondary | ICD-10-CM | POA: Diagnosis not present

## 2022-11-22 ENCOUNTER — Other Ambulatory Visit: Payer: Self-pay

## 2022-11-22 ENCOUNTER — Telehealth: Payer: Self-pay

## 2022-11-22 DIAGNOSIS — R748 Abnormal levels of other serum enzymes: Secondary | ICD-10-CM

## 2022-11-22 NOTE — Telephone Encounter (Signed)
Received personal reminder in EPIC for pt to have labs drawn 1st week of December: Pt was made aware: Pt stated that she just had a physical about a month ago with her PCP Dr. Harlan Stains with lots of labs drawn and everything was normal including alkaline phosphate level

## 2022-11-22 NOTE — Telephone Encounter (Signed)
-----   Message from Gillermina Hu, RN sent at 06/11/2022  1:45 PM EDT ----- Alkaline phosphatase back to normal and the parathyroid test is normal Good news!   Let's recheck the labs in early December, ok? First week or so - you can put a reminder in/on your calendar  Gatha Mayer, MD, [facility]

## 2022-11-22 NOTE — Telephone Encounter (Signed)
OK No further testing here at this time.

## 2022-12-21 DIAGNOSIS — N3941 Urge incontinence: Secondary | ICD-10-CM | POA: Diagnosis not present

## 2022-12-21 DIAGNOSIS — N3010 Interstitial cystitis (chronic) without hematuria: Secondary | ICD-10-CM | POA: Diagnosis not present

## 2022-12-22 ENCOUNTER — Telehealth: Payer: Self-pay | Admitting: Cardiology

## 2022-12-22 NOTE — Addendum Note (Signed)
Addended by: Raman Featherston, Jonelle Sidle L on: 12/22/2022 02:10 PM   Modules accepted: Orders

## 2022-12-22 NOTE — Telephone Encounter (Signed)
Spoke with patient and informed her the results have been released and she should be able to go and have the lab work done as they should be able to see it now.  I gave her the hours of the [facility].  She thanked me for the call and had no additional questions.

## 2022-12-22 NOTE — Telephone Encounter (Signed)
Patient states her medication was changed in October, 2023, and she was advised to wait 60 days and then repeat labs. She states she called [employer] [facility] and they informed her that they are unable to see/access the labs in the system. Patient would like to know if we can contact them to let them know that the order is in the system.

## 2022-12-23 DIAGNOSIS — I700 Atherosclerosis of aorta: Secondary | ICD-10-CM | POA: Diagnosis not present

## 2022-12-24 ENCOUNTER — Other Ambulatory Visit: Payer: Self-pay

## 2022-12-24 DIAGNOSIS — E7841 Elevated Lipoprotein(a): Secondary | ICD-10-CM

## 2022-12-24 LAB — LIPID PANEL
Chol/HDL Ratio: 3.3 ratio (ref 0.0–4.4)
Cholesterol, Total: 155 mg/dL (ref 100–199)
HDL: 47 mg/dL (ref >39)
LDL Chol Calc (NIH): 88 mg/dL (ref 0–99)
Triglycerides: 109 mg/dL (ref 0–149)
VLDL Cholesterol Cal: 20 mg/dL (ref 5–40)

## 2022-12-28 ENCOUNTER — Ambulatory Visit (HOSPITAL_BASED_OUTPATIENT_CLINIC_OR_DEPARTMENT_OTHER): Payer: Medicare PPO | Admitting: Cardiology

## 2022-12-28 ENCOUNTER — Encounter (HOSPITAL_BASED_OUTPATIENT_CLINIC_OR_DEPARTMENT_OTHER): Payer: Self-pay | Admitting: Cardiology

## 2022-12-28 VITALS — BP 112/75 | HR 76 | Ht 62.0 in | Wt 140.0 lb

## 2022-12-28 DIAGNOSIS — Z7189 Other specified counseling: Secondary | ICD-10-CM

## 2022-12-28 DIAGNOSIS — I700 Atherosclerosis of aorta: Secondary | ICD-10-CM | POA: Diagnosis not present

## 2022-12-28 DIAGNOSIS — R931 Abnormal findings on diagnostic imaging of heart and coronary circulation: Secondary | ICD-10-CM

## 2022-12-28 DIAGNOSIS — E7841 Elevated Lipoprotein(a): Secondary | ICD-10-CM

## 2022-12-28 NOTE — Patient Instructions (Signed)
Medication Instructions:  Your physician recommends that you continue on your current medications as directed. Please refer to the Current Medication list given to you today.  *If you need a refill on your cardiac medications before your next appointment, please call your pharmacy*  Lab Work: NONE  Testing/Procedures: NONE  Follow-Up: At [facility], you and your health needs are our priority.  As part of our continuing mission to provide you with exceptional heart care, we have created designated Provider Care Teams.  These Care Teams include your primary Cardiologist (physician) and Advanced Practice Providers (APPs -  Physician Assistants and Nurse Practitioners) who all work together to provide you with the care you need, when you need it.  We recommend signing up for the patient portal called "MyChart".  Sign up information is provided on this After Visit Summary.  MyChart is used to connect with patients for Virtual Visits (Telemedicine).  Patients are able to view lab/test results, encounter notes, upcoming appointments, etc.  Non-urgent messages can be sent to your provider as well.   To learn more about what you can do with MyChart, go to [email or web address].    Your next appointment:   3 month(s)  The format for your next appointment:   In Person  Provider:   Bridgette Christopher, MD     

## 2022-12-28 NOTE — Progress Notes (Signed)
Cardiology Office Note:    Date:  12/31/2022   ID:  Jenna Li, DOB [birth year], MRN [id number]  PCP:  Harlan Stains, MD  Cardiologist:  Buford Dresser, MD  Referring MD: Harlan Stains, MD   CC: establish care/general cardiology follow up  History of Present Illness:    Jenna Li is a 67 y.o. female with a hx of breast cancer and dyslipidemia with elevated LP(a) and a coronary calcium score 1.6/58th percentile  who is seen as a new patient to me today. She was previously followed by Dr. Bettina Gavia.  Today, the patient states that she has been doing well. She hasn't noticed any cardiological issues which limit activity, her main stressors are the recurrence of her breast cancer and the health of her mother.  We discussed the meaning of her elevated LP(a) in conjunction with her calcium score and possible treatment options. She notes that her diet is ok and she exercises often.  Her dad and twin brothers all had cholesterol issues and have been medicated. None of her family knows if their LP(a) levels are elevated.  When she walks long distances up stairs she has to catch her breath but she doesn't believe it to be abnormal.  She denies any palpitations, chest pain, shortness of breath, or peripheral edema. No lightheadedness, headaches, syncope, orthopnea, or PND.  Past Medical History:  Diagnosis Date   Anatomical narrow angle of both eyes 02/09/2013   Anxiety    Arthritis    Basal cell carcinoma of face    Breast cancer ([place]) 2016   Colitis - acute nonspecific at colonoscopy 08/28/2014   Conjunctival cyst of right eye 02/09/2013   Crohn's colitis - suspected 08/28/2014   Elevated coronary artery calcium score 02/01/2020   Encounter for cardiac risk counseling 11/29/2018   Frequency of micturition 11/28/2012   Heart murmur    History of pregnancy 02/22/2013   Overview:  Menopause early 71s, on ERT since  Formatting of this note might be different from the  original. Menopause early 50s, on ERT since   Hyperlipidemia    Insomnia 02/08/2013   Interstitial cystitis    Interstitial cystitis (chronic) without hematuria 11/28/2012   Kidney stones    Malignant neoplasm of right breast, stage 1 ([place]) 06/22/2015   Menopausal vaginal dryness 05/20/2019   Menopause    Migraine headache    Nephrolithiasis 10/12/2013   Nuclear sclerotic cataract of both eyes 03/02/2013   Osteoporosis of forearm 09/22/2017   Palpitation 11/29/2018   Seasonal allergies    Tachycardia 11/29/2018   Urinary urgency 10/12/2012   Uterine fibroid 08/28/2013   Overview:  INcidental finding on TVUS in 08/2012 to work up pelvic pain.  3 cm posterior fibroid and 2 cm intramural central fibroid  Formatting of this note might be different from the original. INcidental finding on TVUS in 08/2012 to work up pelvic pain.  3 cm posterior fibroid and 2 cm intramural central fibroid    Past Surgical History:  Procedure Laterality Date   BREAST LUMPECTOMY Right    CESAREAN SECTION     x 2   COLONOSCOPY     TONSILLECTOMY      Current Medications: Current Outpatient Medications on File Prior to Visit  Medication Sig   celecoxib (CELEBREX) 200 MG capsule Take 200 mg by mouth daily as needed for mild pain.   EMGALITY 120 MG/ML SOAJ Inject 1 mL into the skin every 30 (thirty) days.   ezetimibe (ZETIA) 10 MG tablet  Take 1 tablet (10 mg total) by mouth daily.   loratadine (CLARITIN) 10 MG tablet Take 10 mg by mouth daily.   mirabegron ER (MYRBETRIQ) 50 MG TB24 tablet Take 50 mg by mouth daily.   nitrofurantoin (MACRODANTIN) 50 MG capsule Take 50 mg by mouth at bedtime as needed (UTI).   propranolol ER (INDERAL LA) 60 MG 24 hr capsule Take 60 mg by mouth daily.   rosuvastatin (CRESTOR) 10 MG tablet Take 1 tablet (10 mg total) by mouth daily.   tretinoin (RETIN-A) 0.05 % cream    naratriptan (AMERGE) 2.5 MG tablet Take 0.5 tablets by mouth daily as needed for migraine.   No current  facility-administered medications on file prior to visit.     Allergies:   Patient has no known allergies.   Social History   Tobacco Use   Smoking status: Never   Smokeless tobacco: Never  Vaping Use   Vaping Use: Never used  Substance Use Topics   Alcohol use: Yes    Alcohol/week: 1.0 standard drink of alcohol    Types: 1 Glasses of wine per week    Comment: 1 glass of wine on weekends   Drug use: No    Family History: family history includes Alzheimer's disease in her mother; Atrial fibrillation in her mother; Heart disease in her father; Hyperlipidemia in her brother, brother, and father; Multiple myeloma in her paternal grandmother. There is no history of Colon cancer, Esophageal cancer, Stomach cancer, Kidney disease, Liver disease, or Diabetes.  ROS:   Please see the history of present illness.   Additional pertinent ROS otherwise unremarkable.  EKGs/Labs/Other Studies Reviewed:    The following studies were reviewed today: Calcium score 12/22/2018 Coronary calcium score of 1.67 Agatson units. This was 58th percentile for age and sex matched control.  EKG:  EKG is personally reviewed.   12/28/2022: not ordered today  Recent Labs: 05/31/2022: ALT 7  Recent Lipid Panel    Component Value Date/Time   CHOL 155 12/23/2022 1130   TRIG 109 12/23/2022 1130   HDL 47 12/23/2022 1130   CHOLHDL 3.3 12/23/2022 1130   LDLCALC 88 12/23/2022 1130    Physical Exam:    VS:  BP 112/75 (BP Location: Left Arm, Patient Position: Sitting, Cuff Size: Normal)   Pulse 76   Ht '5\' 2"'$  (1.575 m)   Wt 140 lb (63.5 kg)   SpO2 94%   BMI 25.61 kg/m     Wt Readings from Last 3 Encounters:  12/28/22 140 lb (63.5 kg)  08/30/22 141 lb (64 kg)  03/05/22 140 lb (63.5 kg)    GEN: Well nourished, well developed in no acute distress HEENT: Normal, moist mucous membranes NECK: No JVD CARDIAC: regular rhythm, normal S1 and S2, no rubs or gallops. No murmur. VASCULAR: Radial and DP pulses 2+  bilaterally. No carotid bruits RESPIRATORY:  Clear to auscultation without rales, wheezing or rhonchi  ABDOMEN: Soft, non-tender, non-distended MUSCULOSKELETAL:  Ambulates independently SKIN: Warm and dry, no edema NEUROLOGIC:  Alert and oriented x 3. No focal neuro deficits noted. PSYCHIATRIC:  Normal affect    ASSESSMENT:    1. Elevated lipoprotein(a)   2. Thoracic aorta atherosclerosis ([place])   3. Agatston coronary artery calcium score less than 100   4. Cardiac risk counseling   5. Counseling on health promotion and disease prevention    PLAN:    Elevated lp(a) Minimal coronary artery calcification Aortic atherosclerosis -Lp(a) 209, interested in clinical trials if she is ever  a candidate -we discussed the role of PCSK9i in elevated lp(a). She will consider, especially if no opportunity for clinical trials in the near future -recommended first degree family member screening -reviewed red flag warning signs that need immediate medical attention  Cardiac risk counseling and prevention recommendations: -recommend heart healthy/Mediterranean diet, with whole grains, fruits, vegetable, fish, lean meats, nuts, and olive oil. Limit salt. -recommend moderate walking, 3-5 times/week for 30-50 minutes each session. Aim for at least 150 minutes.week. Goal should be pace of 3 miles/hours, or walking 1.5 miles in 30 minutes -recommend avoidance of tobacco products. Avoid excess alcohol.  Plan for follow up: 3 months or PRN  Buford Dresser, MD, PhD, [facility]    Medication Adjustments/Labs and Tests Ordered: Current medicines are reviewed at length with the patient today.  Concerns regarding medicines are outlined above.  No orders of the defined types were placed in this encounter.  No orders of the defined types were placed in this encounter.  Patient Instructions  Medication Instructions:  Your physician recommends that you continue on your current  medications as directed. Please refer to the Current Medication list given to you today.   *If you need a refill on your cardiac medications before your next appointment, please call your pharmacy*  Lab Work: NONE  Testing/Procedures: NONE  Follow-Up: At [facility], you and your health needs are our priority.  As part of our continuing mission to provide you with exceptional heart care, we have created designated Provider Care Teams.  These Care Teams include your primary Cardiologist (physician) and Advanced Practice Providers (APPs -  Physician Assistants and Nurse Practitioners) who all work together to provide you with the care you need, when you need it.  We recommend signing up for the patient portal called "MyChart".  Sign up information is provided on this After Visit Summary.  MyChart is used to connect with patients for Virtual Visits (Telemedicine).  Patients are able to view lab/test results, encounter notes, upcoming appointments, etc.  Non-urgent messages can be sent to your provider as well.   To learn more about what you can do with MyChart, go to [email or web address].    Your next appointment:   3 month(s)  The format for your next appointment:   In Person  Provider:   Buford Dresser, MD               I,Coren O'Brien,acting as a scribe for Buford Dresser, MD.,have documented all relevant documentation on the behalf of Buford Dresser, MD,as directed by  Buford Dresser, MD while in the presence of Buford Dresser, MD.  I, Buford Dresser, MD, have reviewed all documentation for this visit. The documentation on 12/31/22 for the exam, diagnosis, procedures, and orders are all accurate and complete.   Signed, Buford Dresser, MD PhD 12/31/2022 11:59 AM    [place]

## 2023-01-05 ENCOUNTER — Other Ambulatory Visit: Payer: Self-pay | Admitting: Cardiology

## 2023-01-10 ENCOUNTER — Encounter (HOSPITAL_BASED_OUTPATIENT_CLINIC_OR_DEPARTMENT_OTHER): Payer: Self-pay

## 2023-01-10 NOTE — Telephone Encounter (Signed)
Please advise 

## 2023-01-17 ENCOUNTER — Telehealth (HOSPITAL_BASED_OUTPATIENT_CLINIC_OR_DEPARTMENT_OTHER): Payer: Self-pay | Admitting: Cardiology

## 2023-01-17 NOTE — Telephone Encounter (Signed)
Spoke with patient to reschedule her 04/19/23 appointment with Dr. Salem Caster states she sent a message last week regarding a new clinical trial that Dr. Harrell Gave had mentioned to her and she has not heard from anyone

## 2023-02-14 DIAGNOSIS — M8589 Other specified disorders of bone density and structure, multiple sites: Secondary | ICD-10-CM | POA: Diagnosis not present

## 2023-02-14 DIAGNOSIS — Z7983 Long term (current) use of bisphosphonates: Secondary | ICD-10-CM | POA: Diagnosis not present

## 2023-02-14 DIAGNOSIS — Z5181 Encounter for therapeutic drug level monitoring: Secondary | ICD-10-CM | POA: Diagnosis not present

## 2023-02-14 DIAGNOSIS — Z7189 Other specified counseling: Secondary | ICD-10-CM | POA: Diagnosis not present

## 2023-03-02 DIAGNOSIS — H04123 Dry eye syndrome of bilateral lacrimal glands: Secondary | ICD-10-CM | POA: Diagnosis not present

## 2023-03-02 DIAGNOSIS — H2513 Age-related nuclear cataract, bilateral: Secondary | ICD-10-CM | POA: Diagnosis not present

## 2023-03-02 DIAGNOSIS — H5203 Hypermetropia, bilateral: Secondary | ICD-10-CM | POA: Diagnosis not present

## 2023-03-02 DIAGNOSIS — H524 Presbyopia: Secondary | ICD-10-CM | POA: Diagnosis not present

## 2023-04-13 DIAGNOSIS — G43709 Chronic migraine without aura, not intractable, without status migrainosus: Secondary | ICD-10-CM | POA: Diagnosis not present

## 2023-04-15 DIAGNOSIS — M810 Age-related osteoporosis without current pathological fracture: Secondary | ICD-10-CM | POA: Diagnosis not present

## 2023-04-19 ENCOUNTER — Ambulatory Visit (HOSPITAL_BASED_OUTPATIENT_CLINIC_OR_DEPARTMENT_OTHER): Payer: Medicare PPO | Admitting: Cardiology

## 2023-04-20 ENCOUNTER — Ambulatory Visit (HOSPITAL_BASED_OUTPATIENT_CLINIC_OR_DEPARTMENT_OTHER): Payer: Medicare PPO | Admitting: Cardiology

## 2023-04-20 DIAGNOSIS — L578 Other skin changes due to chronic exposure to nonionizing radiation: Secondary | ICD-10-CM | POA: Diagnosis not present

## 2023-04-20 DIAGNOSIS — Z85828 Personal history of other malignant neoplasm of skin: Secondary | ICD-10-CM | POA: Diagnosis not present

## 2023-04-20 DIAGNOSIS — L821 Other seborrheic keratosis: Secondary | ICD-10-CM | POA: Diagnosis not present

## 2023-04-20 DIAGNOSIS — W908XXS Exposure to other nonionizing radiation, sequela: Secondary | ICD-10-CM | POA: Diagnosis not present

## 2023-04-20 DIAGNOSIS — L570 Actinic keratosis: Secondary | ICD-10-CM | POA: Diagnosis not present

## 2023-04-20 DIAGNOSIS — L820 Inflamed seborrheic keratosis: Secondary | ICD-10-CM | POA: Diagnosis not present

## 2023-04-21 DIAGNOSIS — R748 Abnormal levels of other serum enzymes: Secondary | ICD-10-CM | POA: Diagnosis not present

## 2023-04-21 DIAGNOSIS — Z8639 Personal history of other endocrine, nutritional and metabolic disease: Secondary | ICD-10-CM | POA: Diagnosis not present

## 2023-04-21 DIAGNOSIS — Z08 Encounter for follow-up examination after completed treatment for malignant neoplasm: Secondary | ICD-10-CM | POA: Diagnosis not present

## 2023-04-21 DIAGNOSIS — Z853 Personal history of malignant neoplasm of breast: Secondary | ICD-10-CM | POA: Diagnosis not present

## 2023-04-28 DIAGNOSIS — R350 Frequency of micturition: Secondary | ICD-10-CM | POA: Diagnosis not present

## 2023-04-28 DIAGNOSIS — N3000 Acute cystitis without hematuria: Secondary | ICD-10-CM | POA: Diagnosis not present

## 2023-05-25 ENCOUNTER — Ambulatory Visit (HOSPITAL_BASED_OUTPATIENT_CLINIC_OR_DEPARTMENT_OTHER): Payer: Medicare PPO | Admitting: Cardiology

## 2023-05-25 ENCOUNTER — Encounter (HOSPITAL_BASED_OUTPATIENT_CLINIC_OR_DEPARTMENT_OTHER): Payer: Self-pay | Admitting: Cardiology

## 2023-05-25 VITALS — BP 116/78 | HR 73 | Ht 62.0 in | Wt 140.0 lb

## 2023-05-25 DIAGNOSIS — E7841 Elevated Lipoprotein(a): Secondary | ICD-10-CM

## 2023-05-25 DIAGNOSIS — I700 Atherosclerosis of aorta: Secondary | ICD-10-CM

## 2023-05-25 DIAGNOSIS — Z7189 Other specified counseling: Secondary | ICD-10-CM | POA: Diagnosis not present

## 2023-05-25 DIAGNOSIS — R931 Abnormal findings on diagnostic imaging of heart and coronary circulation: Secondary | ICD-10-CM

## 2023-05-25 NOTE — Patient Instructions (Signed)
Medication Instructions:  Your physician recommends that you continue on your current medications as directed. Please refer to the Current Medication list given to you today.   *If you need a refill on your cardiac medications before your next appointment, please call your pharmacy*  Lab Work: NONE  Testing/Procedures: NONE   Follow-Up: At [facility], you and your health needs are our priority.  As part of our continuing mission to provide you with exceptional heart care, we have created designated Provider Care Teams.  These Care Teams include your primary Cardiologist (physician) and Advanced Practice Providers (APPs -  Physician Assistants and Nurse Practitioners) who all work together to provide you with the care you need, when you need it.  We recommend signing up for the patient portal called "MyChart".  Sign up information is provided on this After Visit Summary.  MyChart is used to connect with patients for Virtual Visits (Telemedicine).  Patients are able to view lab/test results, encounter notes, upcoming appointments, etc.  Non-urgent messages can be sent to your provider as well.   To learn more about what you can do with MyChart, go to [email or web address].    Your next appointment:   6 month(s)  The format for your next appointment:   In Person  Provider:   Bridgette Christopher, MD             

## 2023-05-25 NOTE — Progress Notes (Signed)
Cardiology Office Note:    Date:  05/25/2023   ID:  [name], DOB 1956-09-17, MRN [id number]  PCP:  Laurann Montana, MD  Cardiologist:  Jodelle Red, MD  Referring MD: Laurann Montana, MD   CC: Follow up  History of Present Illness:    Jenna Li is a 67 y.o. female with a hx of breast cancer and dyslipidemia with elevated LP(a) and a coronary calcium score 1.6/58th percentile, who is seen for follow-up. She was previously followed by Dr. Dulce Sellar.  At her last visit, she was doing well aside from stressors including the recurrence of her breast cancer and the health of her mother. She endorsed a good diet and was exercising often. She noted some dyspnea with walking long distances upstairs; she didn't consider this abnormal. Her father and twin brothers all had cholesterol issues and have been medicated. None of her family knows if their LP(a) levels are elevated. We discussed the meaning of her elevated LP(a) in conjunction with her calcium score and possible treatment options including PCSK9i. Her LP(a) was 209. She was interested in clinical trials if she was ever a candidate.  Today, she states that she is generally feeling okay. She has been struggling with significant family/life stressors lately.  Walking routinely for exercise. She denies any anginal symptoms or shortness of breath. In the past year she has travelled frequently and was able to stay active.  She reports having blood work performed in January while participating in a [facility] Access clinical trial.   She denies any palpitations, peripheral edema, lightheadedness, headaches, syncope, orthopnea, or PND.   Past Medical History:  Diagnosis Date   Anatomical narrow angle of both eyes 02/09/2013   Anxiety    Arthritis    Basal cell carcinoma of face    Breast cancer (HCC) 2016   Colitis - acute nonspecific at colonoscopy 08/28/2014   Conjunctival cyst of right eye 02/09/2013   Crohn's colitis -  suspected 08/28/2014   Elevated coronary artery calcium score 02/01/2020   Encounter for cardiac risk counseling 11/29/2018   Frequency of micturition 11/28/2012   Heart murmur    History of pregnancy 02/22/2013   Overview:  Menopause early 89s, on ERT since  Formatting of this note might be different from the original. Menopause early 50s, on ERT since   Hyperlipidemia    Insomnia 02/08/2013   Interstitial cystitis    Interstitial cystitis (chronic) without hematuria 11/28/2012   Kidney stones    Malignant neoplasm of right breast, stage 1 (HCC) 06/22/2015   Menopausal vaginal dryness 05/20/2019   Menopause    Migraine headache    Nephrolithiasis 10/12/2013   Nuclear sclerotic cataract of both eyes 03/02/2013   Osteoporosis of forearm 09/22/2017   Palpitation 11/29/2018   Seasonal allergies    Tachycardia 11/29/2018   Urinary urgency 10/12/2012   Uterine fibroid 08/28/2013   Overview:  INcidental finding on TVUS in 08/2012 to work up pelvic pain.  3 cm posterior fibroid and 2 cm intramural central fibroid  Formatting of this note might be different from the original. INcidental finding on TVUS in 08/2012 to work up pelvic pain.  3 cm posterior fibroid and 2 cm intramural central fibroid    Past Surgical History:  Procedure Laterality Date   BREAST LUMPECTOMY Right    CESAREAN SECTION     x 2   COLONOSCOPY     TONSILLECTOMY      Current Medications: Current Outpatient Medications on File Prior to  Visit  Medication Sig   AIMOVIG 140 MG/ML SOAJ Inject 1 mL into the skin every 30 (thirty) days.   celecoxib (CELEBREX) 200 MG capsule Take 200 mg by mouth daily as needed for mild pain.   loratadine (CLARITIN) 10 MG tablet Take 10 mg by mouth daily.   mirabegron ER (MYRBETRIQ) 50 MG TB24 tablet Take 50 mg by mouth daily.   nitrofurantoin (MACRODANTIN) 50 MG capsule Take 50 mg by mouth at bedtime as needed (UTI).   propranolol ER (INDERAL LA) 60 MG 24 hr capsule Take 60 mg by mouth  daily.   rosuvastatin (CRESTOR) 10 MG tablet Take 1 tablet (10 mg total) by mouth daily.   naratriptan (AMERGE) 2.5 MG tablet Take 0.5 tablets by mouth daily as needed for migraine.   No current facility-administered medications on file prior to visit.     Allergies:   Patient has no known allergies.   Social History   Tobacco Use   Smoking status: Never   Smokeless tobacco: Never  Vaping Use   Vaping Use: Never used  Substance Use Topics   Alcohol use: Yes    Alcohol/week: 1.0 standard drink of alcohol    Types: 1 Glasses of wine per week    Comment: 1 glass of wine on weekends   Drug use: No    Family History: family history includes Alzheimer's disease in her mother; Atrial fibrillation in her mother; Heart disease in her father; Hyperlipidemia in her brother, brother, and father; Multiple myeloma in her paternal grandmother. There is no history of Colon cancer, Esophageal cancer, Stomach cancer, Kidney disease, Liver disease, or Diabetes.  ROS:   Please see the history of present illness.   (+) Stress Additional pertinent ROS otherwise unremarkable.  EKGs/Labs/Other Studies Reviewed:    The following studies were reviewed today:  Calcium score 12/22/2018 Coronary calcium score of 1.67 Agatson units. This was 58th percentile for age and sex matched control.  EKG:  EKG is personally reviewed.   05/25/2023:  NSR at 73 bpm with iRBBB 12/28/2022: not ordered  Recent Labs: 05/31/2022: ALT 7   Recent Lipid Panel    Component Value Date/Time   CHOL 155 12/23/2022 1130   TRIG 109 12/23/2022 1130   HDL 47 12/23/2022 1130   CHOLHDL 3.3 12/23/2022 1130   LDLCALC 88 12/23/2022 1130    Physical Exam:    VS:  BP 116/78   Pulse 73   Ht 5\' 2"  (1.575 m)   Wt 140 lb (63.5 kg)   BMI 25.61 kg/m     Wt Readings from Last 3 Encounters:  05/25/23 140 lb (63.5 kg)  12/28/22 140 lb (63.5 kg)  08/30/22 141 lb (64 kg)    GEN: Well nourished, well developed in no acute  distress HEENT: Normal, moist mucous membranes NECK: No JVD CARDIAC: regular rhythm, normal S1 and S2, no rubs or gallops. No murmur. VASCULAR: Radial and DP pulses 2+ bilaterally. No carotid bruits RESPIRATORY:  Clear to auscultation without rales, wheezing or rhonchi  ABDOMEN: Soft, non-tender, non-distended MUSCULOSKELETAL:  Ambulates independently SKIN: Warm and dry, no edema NEUROLOGIC:  Alert and oriented x 3. No focal neuro deficits noted. PSYCHIATRIC:  Normal affect    ASSESSMENT:    1. Elevated lipoprotein(a)   2. Thoracic aorta atherosclerosis (HCC)   3. Agatston coronary artery calcium score less than 100   4. Cardiac risk counseling     PLAN:    Elevated lp(a) Minimal coronary artery calcification Aortic atherosclerosis -Lp(a)  209, interested in clinical trials if she is ever a candidate -we discussed the role of PCSK9i in elevated lp(a). She will consider, especially if no opportunity for clinical trials in the near future -recommended first degree family member screening -continue rosuvastatin for general lipid management -reviewed red flag warning signs that need immediate medical attention  Cardiac risk counseling and prevention recommendations: -recommend heart healthy/Mediterranean diet, with whole grains, fruits, vegetable, fish, lean meats, nuts, and olive oil. Limit salt. -recommend moderate walking, 3-5 times/week for 30-50 minutes each session. Aim for at least 150 minutes.week. Goal should be pace of 3 miles/hours, or walking 1.5 miles in 30 minutes -recommend avoidance of tobacco products. Avoid excess alcohol.  Plan for follow up:  6 months or sooner as needed.  Jodelle Red, MD, PhD, [facility]  [facility]    Medication Adjustments/Labs and Tests Ordered: Current medicines are reviewed at length with the patient today.  Concerns regarding medicines are outlined above.   Orders Placed This Encounter  Procedures   EKG 12-Lead    No orders of the defined types were placed in this encounter.  Patient Instructions  Medication Instructions:  Your physician recommends that you continue on your current medications as directed. Please refer to the Current Medication list given to you today.  *If you need a refill on your cardiac medications before your next appointment, please call your pharmacy*  Lab Work: NONE  Testing/Procedures: NONE  Follow-Up: At [facility], you and your health needs are our priority.  As part of our continuing mission to provide you with exceptional heart care, we have created designated Provider Care Teams.  These Care Teams include your primary Cardiologist (physician) and Advanced Practice Providers (APPs -  Physician Assistants and Nurse Practitioners) who all work together to provide you with the care you need, when you need it.  We recommend signing up for the patient portal called "MyChart".  Sign up information is provided on this After Visit Summary.  MyChart is used to connect with patients for Virtual Visits (Telemedicine).  Patients are able to view lab/test results, encounter notes, upcoming appointments, etc.  Non-urgent messages can be sent to your provider as well.   To learn more about what you can do with MyChart, go to [email or web address].    Your next appointment:   6 month(s)  The format for your next appointment:   In Person  Provider:   Jodelle Red, MD       [facility],acting as a scribe for Jodelle Red, MD.,have documented all relevant documentation on the behalf of Jodelle Red, MD,as directed by  Jodelle Red, MD while in the presence of Jodelle Red, MD.  I, Carlena Bjornstad, have reviewed all documentation for this visit. The documentation on 05/25/23 for the exam, diagnosis, procedures, and orders are all accurate and complete.   Signed, Jodelle Red, MD PhD 05/25/2023 5:07 PM    [facility]

## 2023-06-06 DIAGNOSIS — R3915 Urgency of urination: Secondary | ICD-10-CM | POA: Diagnosis not present

## 2023-07-07 DIAGNOSIS — H00012 Hordeolum externum right lower eyelid: Secondary | ICD-10-CM | POA: Diagnosis not present

## 2023-07-14 ENCOUNTER — Encounter (HOSPITAL_BASED_OUTPATIENT_CLINIC_OR_DEPARTMENT_OTHER): Payer: Self-pay | Admitting: Cardiology

## 2023-09-13 ENCOUNTER — Other Ambulatory Visit: Payer: Self-pay | Admitting: Cardiology

## 2023-10-06 DIAGNOSIS — J0110 Acute frontal sinusitis, unspecified: Secondary | ICD-10-CM | POA: Diagnosis not present

## 2023-10-10 DIAGNOSIS — R3915 Urgency of urination: Secondary | ICD-10-CM | POA: Diagnosis not present

## 2023-10-10 DIAGNOSIS — N3010 Interstitial cystitis (chronic) without hematuria: Secondary | ICD-10-CM | POA: Diagnosis not present

## 2023-10-27 DIAGNOSIS — Z08 Encounter for follow-up examination after completed treatment for malignant neoplasm: Secondary | ICD-10-CM | POA: Diagnosis not present

## 2023-10-27 DIAGNOSIS — Z853 Personal history of malignant neoplasm of breast: Secondary | ICD-10-CM | POA: Diagnosis not present

## 2023-10-27 DIAGNOSIS — R92323 Mammographic fibroglandular density, bilateral breasts: Secondary | ICD-10-CM | POA: Diagnosis not present

## 2023-10-27 DIAGNOSIS — Z923 Personal history of irradiation: Secondary | ICD-10-CM | POA: Diagnosis not present

## 2023-10-27 DIAGNOSIS — Z1231 Encounter for screening mammogram for malignant neoplasm of breast: Secondary | ICD-10-CM | POA: Diagnosis not present

## 2023-11-02 DIAGNOSIS — Z85828 Personal history of other malignant neoplasm of skin: Secondary | ICD-10-CM | POA: Diagnosis not present

## 2023-11-02 DIAGNOSIS — L570 Actinic keratosis: Secondary | ICD-10-CM | POA: Diagnosis not present

## 2023-11-02 DIAGNOSIS — L578 Other skin changes due to chronic exposure to nonionizing radiation: Secondary | ICD-10-CM | POA: Diagnosis not present

## 2023-11-02 DIAGNOSIS — L821 Other seborrheic keratosis: Secondary | ICD-10-CM | POA: Diagnosis not present

## 2023-11-02 DIAGNOSIS — W908XXD Exposure to other nonionizing radiation, subsequent encounter: Secondary | ICD-10-CM | POA: Diagnosis not present

## 2023-11-08 DIAGNOSIS — D251 Intramural leiomyoma of uterus: Secondary | ICD-10-CM | POA: Diagnosis not present

## 2023-11-08 DIAGNOSIS — N958 Other specified menopausal and perimenopausal disorders: Secondary | ICD-10-CM | POA: Diagnosis not present

## 2023-11-09 DIAGNOSIS — M810 Age-related osteoporosis without current pathological fracture: Secondary | ICD-10-CM | POA: Diagnosis not present

## 2023-11-09 DIAGNOSIS — Z853 Personal history of malignant neoplasm of breast: Secondary | ICD-10-CM | POA: Diagnosis not present

## 2023-11-09 DIAGNOSIS — G43009 Migraine without aura, not intractable, without status migrainosus: Secondary | ICD-10-CM | POA: Diagnosis not present

## 2023-11-09 DIAGNOSIS — F419 Anxiety disorder, unspecified: Secondary | ICD-10-CM | POA: Diagnosis not present

## 2023-11-09 DIAGNOSIS — F325 Major depressive disorder, single episode, in full remission: Secondary | ICD-10-CM | POA: Diagnosis not present

## 2023-11-09 DIAGNOSIS — E7841 Elevated Lipoprotein(a): Secondary | ICD-10-CM | POA: Diagnosis not present

## 2023-11-09 DIAGNOSIS — E785 Hyperlipidemia, unspecified: Secondary | ICD-10-CM | POA: Diagnosis not present

## 2023-11-09 DIAGNOSIS — Z0000 Encounter for general adult medical examination without abnormal findings: Secondary | ICD-10-CM | POA: Diagnosis not present

## 2023-11-09 DIAGNOSIS — M2550 Pain in unspecified joint: Secondary | ICD-10-CM | POA: Diagnosis not present

## 2023-11-21 DIAGNOSIS — Z79899 Other long term (current) drug therapy: Secondary | ICD-10-CM | POA: Diagnosis not present

## 2023-11-21 DIAGNOSIS — G43709 Chronic migraine without aura, not intractable, without status migrainosus: Secondary | ICD-10-CM | POA: Diagnosis not present

## 2023-12-06 ENCOUNTER — Ambulatory Visit (HOSPITAL_BASED_OUTPATIENT_CLINIC_OR_DEPARTMENT_OTHER): Payer: Medicare PPO | Admitting: Cardiology

## 2023-12-10 ENCOUNTER — Other Ambulatory Visit: Payer: Self-pay | Admitting: Cardiology

## 2023-12-19 DIAGNOSIS — Z780 Asymptomatic menopausal state: Secondary | ICD-10-CM | POA: Diagnosis not present

## 2023-12-19 DIAGNOSIS — D251 Intramural leiomyoma of uterus: Secondary | ICD-10-CM | POA: Diagnosis not present

## 2023-12-19 DIAGNOSIS — M85852 Other specified disorders of bone density and structure, left thigh: Secondary | ICD-10-CM | POA: Diagnosis not present

## 2023-12-29 DIAGNOSIS — N3000 Acute cystitis without hematuria: Secondary | ICD-10-CM | POA: Diagnosis not present

## 2023-12-29 DIAGNOSIS — R102 Pelvic and perineal pain: Secondary | ICD-10-CM | POA: Diagnosis not present

## 2024-01-02 DIAGNOSIS — N3010 Interstitial cystitis (chronic) without hematuria: Secondary | ICD-10-CM | POA: Diagnosis not present

## 2024-01-02 DIAGNOSIS — M810 Age-related osteoporosis without current pathological fracture: Secondary | ICD-10-CM | POA: Diagnosis not present

## 2024-01-02 DIAGNOSIS — Z853 Personal history of malignant neoplasm of breast: Secondary | ICD-10-CM | POA: Diagnosis not present

## 2024-01-02 DIAGNOSIS — G43709 Chronic migraine without aura, not intractable, without status migrainosus: Secondary | ICD-10-CM | POA: Diagnosis not present

## 2024-01-02 DIAGNOSIS — Z923 Personal history of irradiation: Secondary | ICD-10-CM | POA: Diagnosis not present

## 2024-02-17 ENCOUNTER — Ambulatory Visit (HOSPITAL_BASED_OUTPATIENT_CLINIC_OR_DEPARTMENT_OTHER): Payer: Medicare PPO | Admitting: Cardiology

## 2024-02-17 ENCOUNTER — Encounter (HOSPITAL_BASED_OUTPATIENT_CLINIC_OR_DEPARTMENT_OTHER): Payer: Self-pay | Admitting: Cardiology

## 2024-02-17 VITALS — BP 102/66 | HR 75 | Ht 62.0 in | Wt 148.3 lb

## 2024-02-17 DIAGNOSIS — E7841 Elevated Lipoprotein(a): Secondary | ICD-10-CM | POA: Diagnosis not present

## 2024-02-17 DIAGNOSIS — I700 Atherosclerosis of aorta: Secondary | ICD-10-CM | POA: Diagnosis not present

## 2024-02-17 DIAGNOSIS — E7800 Pure hypercholesterolemia, unspecified: Secondary | ICD-10-CM

## 2024-02-17 DIAGNOSIS — I2510 Atherosclerotic heart disease of native coronary artery without angina pectoris: Secondary | ICD-10-CM | POA: Diagnosis not present

## 2024-02-17 DIAGNOSIS — Z7189 Other specified counseling: Secondary | ICD-10-CM

## 2024-02-17 MED ORDER — ROSUVASTATIN CALCIUM 20 MG PO TABS
20.0000 mg | ORAL_TABLET | Freq: Every day | ORAL | 11 refills | Status: AC
Start: 2024-02-17 — End: ?

## 2024-02-17 NOTE — Patient Instructions (Addendum)
 Medication Instructions:  Your physician has recommended you make the following change in your medication:  START Rosuvastatin 20 mg daily  *If you need a refill on your cardiac medications before your next appointment, please call your pharmacy*  Lab Work: Your physician recommends that you return for lab work in 3 months: LIPID   Follow-Up: At [facility], you and your health needs are our priority.  As part of our continuing mission to provide you with exceptional heart care, we have created designated Provider Care Teams.  These Care Teams include your primary Cardiologist (physician) and Advanced Practice Providers (APPs -  Physician Assistants and Nurse Practitioners) who all work together to provide you with the care you need, when you need it.  We recommend signing up for the patient portal called "MyChart".  Sign up information is provided on this After Visit Summary.  MyChart is used to connect with patients for Virtual Visits (Telemedicine).  Patients are able to view lab/test results, encounter notes, upcoming appointments, etc.  Non-urgent messages can be sent to your provider as well.   To learn more about what you can do with MyChart, go to [email or web address].    Your next appointment:   6 month(s)  Provider:   Jodelle Red, MD, Gillian Shields, NP or Eligha Bridegroom, NP

## 2024-02-17 NOTE — Progress Notes (Signed)
 Cardiology Office Note:  .   Date:  02/17/2024  ID:  Jenna Li, DOB 1956/12/18, MRN [id number] PCP: Laurann Montana, MD  [facility] Providers Cardiologist:  Jodelle Red, MD {  History of Present Illness: .   Jenna Li is a 68 y.o. female with a hx of breast cancer, nonobstructive CAD by CT, and hypercholesterolemia with elevated LP(a) and a coronary calcium score 1.6/58th percentile, who is seen for follow-up. She was previously followed by Dr. Dulce Sellar.   CV history: We have discussed the meaning of her elevated LP(a) in conjunction with her calcium score and possible treatment options including PCSK9i. Her initial LP(a) was 73. She was interested in clinical trials if she was ever a candidate.   Today: We reviewed her recent labs from [place]. Lp(a), lipids reviewed. Discussed where we are with treatments for lp(a). Reviewed her goals for LDL, options for achieving.   Walks about 3 miles most days without symptoms. Reviewed diet recommendations. She does fairly well but has a few areas she is working on.  ROS: Denies chest pain, shortness of breath at rest or with normal exertion. No PND, orthopnea, LE edema or unexpected weight gain. No syncope or palpitations. ROS otherwise negative except as noted.   Studies Reviewed: Marland Kitchen    EKG:       Physical Exam:   VS:  BP 102/66   Pulse 75   Ht 5\' 2"  (1.575 m)   Wt 148 lb 4.8 oz (67.3 kg)   SpO2 98%   BMI 27.12 kg/m    Wt Readings from Last 3 Encounters:  02/17/24 148 lb 4.8 oz (67.3 kg)  05/25/23 140 lb (63.5 kg)  12/28/22 140 lb (63.5 kg)    GEN: Well nourished, well developed in no acute distress HEENT: Normal, moist mucous membranes NECK: No JVD CARDIAC: regular rhythm, normal S1 and S2, no rubs or gallops. No murmur. VASCULAR: Radial and DP pulses 2+ bilaterally. No carotid bruits RESPIRATORY:  Clear to auscultation without rales, wheezing or rhonchi  ABDOMEN: Soft, non-tender,  non-distended MUSCULOSKELETAL:  Ambulates independently SKIN: Warm and dry, no edema NEUROLOGIC:  Alert and oriented x 3. No focal neuro deficits noted. PSYCHIATRIC:  Normal affect    ASSESSMENT AND PLAN: .    Elevated lp(a) Minimal coronary artery calcification consistent with nonobstructive CAD Aortic atherosclerosis -Lp(a) 209 initially, most recent 193, interested in clinical trials if possible -per lipids through [facility] 11/09/23: Tchol 168, HDL 54, TG 102, LDL 96. Goal LDL <70 -we discussed increasing the rosuvastatin dose. She does have some muscle aches at night, but does not notice during the day. Her concern is that increasing the statin will lead to worsening muscle ache. -after shared decision making, will trial rosuvastatin 20 mg dose and recheck lipids. If she does not tolerate, refer to pharmD for PCSK9i -we discussed the role of PCSK9i in elevated lp(a) -recommended first degree family member screening; her brothers have declined -reviewed red flag warning signs that need immediate medical attention  CV risk counseling and prevention -recommend heart healthy/Mediterranean diet, with whole grains, fruits, vegetable, fish, lean meats, nuts, and olive oil. Limit salt. -recommend moderate walking, 3-5 times/week for 30-50 minutes each session. Aim for at least 150 minutes.week. Goal should be pace of 3 miles/hours, or walking 1.5 miles in 30 minutes -recommend avoidance of tobacco products. Avoid excess alcohol.  Dispo: 6 mos with me, Gillian Shields, or Eligha Bridegroom  Signed, Jodelle Red, MD   Jodelle Red, MD,  PhD, [facility]  [facility]  [place]  [facility] & Vascular at [facility] at [facility] [address] [place], [place] [address] [phone]

## 2024-02-20 DIAGNOSIS — N951 Menopausal and female climacteric states: Secondary | ICD-10-CM | POA: Diagnosis not present

## 2024-02-20 DIAGNOSIS — N3010 Interstitial cystitis (chronic) without hematuria: Secondary | ICD-10-CM | POA: Diagnosis not present

## 2024-02-20 DIAGNOSIS — R3915 Urgency of urination: Secondary | ICD-10-CM | POA: Diagnosis not present

## 2024-02-20 DIAGNOSIS — N3281 Overactive bladder: Secondary | ICD-10-CM | POA: Diagnosis not present

## 2024-02-20 DIAGNOSIS — R350 Frequency of micturition: Secondary | ICD-10-CM | POA: Diagnosis not present

## 2024-03-07 DIAGNOSIS — H524 Presbyopia: Secondary | ICD-10-CM | POA: Diagnosis not present

## 2024-03-07 DIAGNOSIS — N898 Other specified noninflammatory disorders of vagina: Secondary | ICD-10-CM | POA: Diagnosis not present

## 2024-03-07 DIAGNOSIS — N952 Postmenopausal atrophic vaginitis: Secondary | ICD-10-CM | POA: Diagnosis not present

## 2024-03-07 DIAGNOSIS — H5203 Hypermetropia, bilateral: Secondary | ICD-10-CM | POA: Diagnosis not present

## 2024-03-07 DIAGNOSIS — Z853 Personal history of malignant neoplasm of breast: Secondary | ICD-10-CM | POA: Diagnosis not present

## 2024-03-07 DIAGNOSIS — H2513 Age-related nuclear cataract, bilateral: Secondary | ICD-10-CM | POA: Diagnosis not present

## 2024-04-23 DIAGNOSIS — Z85828 Personal history of other malignant neoplasm of skin: Secondary | ICD-10-CM | POA: Diagnosis not present

## 2024-04-23 DIAGNOSIS — L570 Actinic keratosis: Secondary | ICD-10-CM | POA: Diagnosis not present

## 2024-04-23 DIAGNOSIS — L820 Inflamed seborrheic keratosis: Secondary | ICD-10-CM | POA: Diagnosis not present

## 2024-04-23 DIAGNOSIS — L821 Other seborrheic keratosis: Secondary | ICD-10-CM | POA: Diagnosis not present

## 2024-04-27 DIAGNOSIS — M810 Age-related osteoporosis without current pathological fracture: Secondary | ICD-10-CM | POA: Diagnosis not present

## 2024-05-03 DIAGNOSIS — Z08 Encounter for follow-up examination after completed treatment for malignant neoplasm: Secondary | ICD-10-CM | POA: Diagnosis not present

## 2024-05-03 DIAGNOSIS — Z8639 Personal history of other endocrine, nutritional and metabolic disease: Secondary | ICD-10-CM | POA: Diagnosis not present

## 2024-05-03 DIAGNOSIS — Z853 Personal history of malignant neoplasm of breast: Secondary | ICD-10-CM | POA: Diagnosis not present

## 2024-05-03 DIAGNOSIS — M810 Age-related osteoporosis without current pathological fracture: Secondary | ICD-10-CM | POA: Diagnosis not present

## 2024-05-21 DIAGNOSIS — M810 Age-related osteoporosis without current pathological fracture: Secondary | ICD-10-CM | POA: Diagnosis not present

## 2024-06-27 DIAGNOSIS — I2510 Atherosclerotic heart disease of native coronary artery without angina pectoris: Secondary | ICD-10-CM | POA: Diagnosis not present

## 2024-06-27 DIAGNOSIS — E7800 Pure hypercholesterolemia, unspecified: Secondary | ICD-10-CM | POA: Diagnosis not present

## 2024-06-28 LAB — LIPID PANEL
Chol/HDL Ratio: 3.4 ratio (ref 0.0–4.4)
Cholesterol, Total: 165 mg/dL (ref 100–199)
HDL: 49 mg/dL (ref >39)
LDL Chol Calc (NIH): 94 mg/dL (ref 0–99)
Triglycerides: 123 mg/dL (ref 0–149)
VLDL Cholesterol Cal: 22 mg/dL (ref 5–40)

## 2024-06-29 ENCOUNTER — Ambulatory Visit (HOSPITAL_BASED_OUTPATIENT_CLINIC_OR_DEPARTMENT_OTHER): Payer: Self-pay | Admitting: Cardiology

## 2024-06-29 DIAGNOSIS — E7841 Elevated Lipoprotein(a): Secondary | ICD-10-CM

## 2024-07-30 DIAGNOSIS — J019 Acute sinusitis, unspecified: Secondary | ICD-10-CM | POA: Diagnosis not present

## 2024-08-03 DIAGNOSIS — N3281 Overactive bladder: Secondary | ICD-10-CM | POA: Diagnosis not present

## 2024-08-03 DIAGNOSIS — R350 Frequency of micturition: Secondary | ICD-10-CM | POA: Diagnosis not present

## 2024-08-06 DIAGNOSIS — G43019 Migraine without aura, intractable, without status migrainosus: Secondary | ICD-10-CM | POA: Diagnosis not present

## 2024-08-14 ENCOUNTER — Ambulatory Visit (HOSPITAL_BASED_OUTPATIENT_CLINIC_OR_DEPARTMENT_OTHER): Admitting: Pharmacist Clinician (PhC)/ Clinical Pharmacy Specialist

## 2024-08-14 VITALS — BP 100/60 | HR 72 | Ht 62.0 in | Wt 140.0 lb

## 2024-08-14 DIAGNOSIS — E785 Hyperlipidemia, unspecified: Secondary | ICD-10-CM

## 2024-08-14 NOTE — Progress Notes (Signed)
 Office Visit    Patient Name: Jenna Li Date of Encounter: 08/16/2024  Primary Care Provider:  Teresa Channel, MD Primary Cardiologist:  Shelda Bruckner, MD  Chief Complaint    Hyperlipidemia   Significant Medical History   CAD CAC = 1.6 (58th percentile); elevated Lp(a)     No Known Allergies  History of Present Illness    Jenna Li is a 68 y.o. female patient of Dr Bruckner, in the office today to discuss options for cholesterol management.  She had hoped to get into an Lp(a) clinical trial with [employer], but didn't qualify.  Is still interested in the possibility.  Only concern is that she is going to be doing Vyepti injections every 3 months for migraine treatment and wants to be sure there is no interaction  Insurance Carrier:  [employer]'s  LDL Cholesterol goal:  LDL < 70  Current Medications: rosuvastatin  20 mg daily   Previously tried:  pravastatin   Family Hx:  pgf died at 16 MI, father had MI around 72, cabg at 85, second cabg 15 years later, brothers both on choelsterol medication; mother AF, dementia;   Social Hx: Tobacco: no Alcohol: wine on the weekends  Diet:  eats out but avoids fried food, describes her diet as not great, but not terrible. Not much fish; some vegetables, some pasta   Exercise: walks regularly 3.5 miles   Accessory Clinical Findings   Lab Results  Component Value Date   CHOL 165 06/27/2024   HDL 49 06/27/2024   LDLCALC 94 06/27/2024   TRIG 123 06/27/2024   CHOLHDL 3.4 06/27/2024    Lipoprotein (a)  Date/Time Value Ref Range Status  08/30/2022 09:22 AM 209.1 (H) <75.0 nmol/L Final    Comment:    Note:  Values greater than or equal to 75.0 nmol/L may        indicate an independent risk factor for CHD,        but must be evaluated with caution when applied        to non-Caucasian populations due to the        influence of genetic factors on Lp(a) across        ethnicities.     Lab  Results  Component Value Date   ALT 7 05/31/2022   AST 20 05/31/2022   ALKPHOS 97 05/31/2022   BILITOT 0.9 05/31/2022   Lab Results  Component Value Date   CREATININE 0.70 12/01/2020   BUN 19 12/01/2020   NA 141 12/01/2020   K 4.3 12/01/2020   CL 101 12/01/2020   CO2 22 12/01/2020   No results found for: HGBA1C  Home Medications    Current Outpatient Medications  Medication Sig Dispense Refill   Atogepant (QULIPTA) 60 MG TABS Take 1 tablet by mouth daily.     celecoxib (CELEBREX) 200 MG capsule Take 200 mg by mouth daily as needed for mild pain.     loratadine (CLARITIN) 10 MG tablet Take 10 mg by mouth daily.     mirabegron ER (MYRBETRIQ) 50 MG TB24 tablet Take 50 mg by mouth daily.     naratriptan (AMERGE) 2.5 MG tablet Take 0.5 tablets by mouth daily as needed for migraine.     nitrofurantoin (MACRODANTIN) 50 MG capsule Take 50 mg by mouth at bedtime as needed (UTI).     propranolol ER (INDERAL LA) 60 MG 24 hr capsule Take 60 mg by mouth daily.  1   rosuvastatin  (CRESTOR ) 20 MG  tablet Take 1 tablet (20 mg total) by mouth daily. 30 tablet 11   No current facility-administered medications for this visit.     Assessment & Plan    Hyperlipidemia LDL goal <70 Assessment: Patient with CAD not at LDL goal of < 70 Most recent LDL 93 on 06/27/24 Has been compliant with high intensity statin : rosuvastatin  20  Reviewed options for lowering LDL cholesterol, including ezetimibe , PCSK-9 inhibitors, bempedoic acid and inclisiran.  Discussed mechanisms of action, dosing, side effects, potential decreases in LDL cholesterol and costs.  Also reviewed potential options for patient assistance.  Plan: Patient agreeable to starting Repatha 140 mg q14d Would like to consider clinical trial for Lp(a) should she qualify Repeat labs after:  3 months Lipid Liver function Patient was given information on [employer] - will see if needed, depending on copay   Allean Mink, PharmD CPP [facility] [address]  [place], [place] [address] [phone]  08/16/2024, 7:07 AM

## 2024-08-14 NOTE — Patient Instructions (Signed)
 Your Results:             Your most recent labs Goal  Total Cholesterol 165 < 200  Triglycerides 123 < 150  HDL (happy/good cholesterol) 49 > 40  LDL (lousy/bad cholesterol 94 < 70  Lp(a) 209 < 75   Medication changes:  We will start the process to get Repatha covered by your insurance.  Once the prior authorization is complete, I will call/send a MyChart message to let you know and confirm pharmacy information.   You will take one injection every 14 days.   Lab orders:  We want to repeat labs after 2-3 months.  We will send you a lab order to remind you once we get closer to that time.     Thank you for choosing [facility]

## 2024-08-16 ENCOUNTER — Encounter (HOSPITAL_BASED_OUTPATIENT_CLINIC_OR_DEPARTMENT_OTHER): Payer: Self-pay | Admitting: Pharmacist Clinician (PhC)/ Clinical Pharmacy Specialist

## 2024-08-16 DIAGNOSIS — E785 Hyperlipidemia, unspecified: Secondary | ICD-10-CM | POA: Insufficient documentation

## 2024-08-16 NOTE — Assessment & Plan Note (Signed)
 Assessment: Patient with CAD not at LDL goal of < 70 Most recent LDL 93 on 06/27/24 Has been compliant with high intensity statin : rosuvastatin  20  Reviewed options for lowering LDL cholesterol, including ezetimibe , PCSK-9 inhibitors, bempedoic acid and inclisiran.  Discussed mechanisms of action, dosing, side effects, potential decreases in LDL cholesterol and costs.  Also reviewed potential options for patient assistance.  Plan: Patient agreeable to starting Repatha 140 mg q14d Would like to consider clinical trial for Lp(a) should she qualify Repeat labs after:  3 months Lipid Liver function Patient was given information on [employer] - will see if needed, depending on copay

## 2024-08-22 DIAGNOSIS — G43709 Chronic migraine without aura, not intractable, without status migrainosus: Secondary | ICD-10-CM | POA: Diagnosis not present

## 2024-08-27 DIAGNOSIS — R519 Headache, unspecified: Secondary | ICD-10-CM | POA: Diagnosis not present

## 2024-08-27 DIAGNOSIS — J0110 Acute frontal sinusitis, unspecified: Secondary | ICD-10-CM | POA: Diagnosis not present

## 2024-08-27 DIAGNOSIS — G43009 Migraine without aura, not intractable, without status migrainosus: Secondary | ICD-10-CM | POA: Diagnosis not present

## 2024-08-27 DIAGNOSIS — B9711 Coxsackievirus as the cause of diseases classified elsewhere: Secondary | ICD-10-CM | POA: Diagnosis not present

## 2024-08-27 DIAGNOSIS — M62838 Other muscle spasm: Secondary | ICD-10-CM | POA: Diagnosis not present

## 2024-09-06 ENCOUNTER — Telehealth: Payer: Self-pay | Admitting: Cardiology

## 2024-09-06 NOTE — Telephone Encounter (Signed)
 I called pt to resch appt.  She stated that she was suppose to rec a call about taking a new med.  She stated she met with pharm D was suppose hear back in a couple of weeks.  She just wanted to follow up   Best number [phone]

## 2024-09-07 ENCOUNTER — Other Ambulatory Visit (HOSPITAL_COMMUNITY): Payer: Self-pay

## 2024-09-07 ENCOUNTER — Telehealth: Payer: Self-pay | Admitting: Pharmacy Technician

## 2024-09-07 ENCOUNTER — Telehealth: Payer: Self-pay | Admitting: Pharmacist Clinician (PhC)/ Clinical Pharmacy Specialist

## 2024-09-07 NOTE — Telephone Encounter (Signed)
   Pharmacy Patient Advocate Encounter   Received notification from Pt Calls Messages that prior authorization for REPATHA is required/requested.   Insurance verification completed.   The patient is insured through [facility] .   Per test claim: PA required; PA submitted to above mentioned insurance via Latent Key/confirmation #/EOC [id number] Status is pending

## 2024-09-07 NOTE — Telephone Encounter (Signed)
 Pharmacy Patient Advocate Encounter  Received notification from [employer] that Prior Authorization for repatha has been APPROVED from 09/07/24 to 12/19/24. Ran test claim, Copay is $0.00. This test claim was processed through [facility]- copay amounts may vary at other pharmacies due to pharmacy/plan contracts, or as the patient moves through the different stages of their insurance plan.   PA #/Case ID/Reference #: [id number]

## 2024-09-07 NOTE — Telephone Encounter (Signed)
 Please do PA for Repatha

## 2024-09-10 ENCOUNTER — Encounter (HOSPITAL_BASED_OUTPATIENT_CLINIC_OR_DEPARTMENT_OTHER): Payer: Self-pay | Admitting: Family

## 2024-09-10 ENCOUNTER — Ambulatory Visit (HOSPITAL_BASED_OUTPATIENT_CLINIC_OR_DEPARTMENT_OTHER): Admitting: Family

## 2024-09-10 VITALS — BP 102/67 | HR 76 | Resp 17 | Ht 62.0 in | Wt 140.0 lb

## 2024-09-10 DIAGNOSIS — E785 Hyperlipidemia, unspecified: Secondary | ICD-10-CM | POA: Diagnosis not present

## 2024-09-10 DIAGNOSIS — R42 Dizziness and giddiness: Secondary | ICD-10-CM

## 2024-09-10 DIAGNOSIS — I2510 Atherosclerotic heart disease of native coronary artery without angina pectoris: Secondary | ICD-10-CM

## 2024-09-10 DIAGNOSIS — E7849 Other hyperlipidemia: Secondary | ICD-10-CM | POA: Diagnosis not present

## 2024-09-10 MED ORDER — REPATHA SURECLICK 140 MG/ML ~~LOC~~ SOAJ: 140.0000 mg | SUBCUTANEOUS | 1 refills | Status: AC

## 2024-09-10 NOTE — Patient Instructions (Addendum)
 Medication Instructions:   START Repatha  140mg  every 14 days  *If you need a refill on your cardiac medications before your next appointment, please call your pharmacy*  Lab Work: Your physician recommends that you return for lab work in 3 months for fasting lipid panel and liver function panel  If you have labs (blood work) drawn today and your tests are completely normal, you will receive your results only by: MyChart Message (if you have MyChart) OR A paper copy in the mail If you have any lab test that is abnormal or we need to change your treatment, we will call you to review the results.  Testing/Procedures: Your physician has requested that you have an echocardiogram. Echocardiography is a painless test that uses sound waves to create images of your heart. It provides your doctor with information about the size and shape of your heart and how well your heart's chambers and valves are working. This procedure takes approximately one hour. There are no restrictions for this procedure. Please do NOT wear cologne, perfume, aftershave, or lotions (deodorant is allowed). Please arrive 15 minutes prior to your appointment time.  Please note: We ask at that you not bring children with you during ultrasound (echo/ vascular) testing. Due to room size and safety concerns, children are not allowed in the ultrasound rooms during exams. Our front office staff cannot provide observation of children in our lobby area while testing is being conducted. An adult accompanying a patient to their appointment will only be allowed in the ultrasound room at the discretion of the ultrasound technician under special circumstances. We apologize for any inconvenience.   Follow-Up: At [facility], you and your health needs are our priority.  As part of our continuing mission to provide you with exceptional heart care, our providers are all part of one team.  This team includes your primary Cardiologist  (physician) and Advanced Practice Providers or APPs (Physician Assistants and Nurse Practitioners) who all work together to provide you with the care you need, when you need it.  Your next appointment:   6 month(s)  Provider:   Shelda Bruckner, MD, Reche Finder, NP, or Kristin Alvstad, PharmD    We recommend signing up for the patient portal called MyChart.  Sign up information is provided on this After Visit Summary.  MyChart is used to connect with patients for Virtual Visits (Telemedicine).  Patients are able to view lab/test results, encounter notes, upcoming appointments, etc.  Non-urgent messages can be sent to your provider as well.   To learn more about what you can do with MyChart, go to [email or web address].   Other Instructions

## 2024-09-10 NOTE — Progress Notes (Signed)
 Cardiology Office Note   Date:  09/10/2024  ID:  Jenna Li, Jenna Li 03-Feb-1956, MRN [id number] PCP: Teresa Channel, MD  [facility] Providers Cardiologist:  Shelda Bruckner, MD     History of Present Illness Jenna Li is a 68 y.o. female with history of breast cancer, nonobstructive CAD by CT, hyperlipidemia with elevated LPA. 12/2018 calcium  score 1.67 placing her in 58th percentile for age/sex matched control.   She was seen by Kristin, PharmD 08/14/24 and was started on Repatha .   Presents today for follow up independently. 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 Notes feeling overall poorly for the last 6 weeks. More headaches and dizziness, has upcoming visit with neurology later this week. Reports episodes that feel like a hot flash is coming starts in her head and goes down through her bilateral arms. Then her bilateral hands/numbs feel numb. The episodes happen quickly and self resolved. This has occurred 2-3 times in the last month, resolves within 30 seconds, occurred at rest of with activity. She notes mild element of near-syncope and describes as frightening. No known prior neck injury. She also notes tingling and itching in her hands and feet.   Describes dizziness and lightheadedness. She had isolated episode of spinning after a dose of Doxycycline. Completed course of doxycycline for sinus infection at direction of PCP. She did have episode recently that made her feel nauseous. No symptoms of amaurosis fugax. She continues to walk multiple miles per week without dyspnea, chest pain.   ROS: Please see the history of present illness.    All other systems reviewed and are negative.   Studies Reviewed      Cardiac Studies & Procedures   ______________________________________________________________________________________________          CT SCANS  CT CARDIAC SCORING (SELF PAY ONLY) 12/22/2018  Addendum 12/25/2018  9:25  AM ADDENDUM REPORT: 12/25/2018 09:23  CLINICAL DATA:  2F with prior breast cancer for risk stratification  EXAM: Coronary Calcium  Score  TECHNIQUE: The patient was scanned on a [employer] scanner. Axial non-contrast 3 mm slices were carried out through the heart. The data set was analyzed on a dedicated work station and scored using the Agatson method.  FINDINGS: Non-cardiac: See separate report from [facility].  Ascending Aorta: Normal size.  3.3 cm.  No calcification.  Pericardium: Normal  Coronary arteries:  Mild calcification noted in the distal RCA.  IMPRESSION: Coronary calcium  score of 1.67 Agatson units. This was 58th percentile for age and sex matched control.  Jenna Scarce, MD   Electronically Signed By: Jenna Li On: 12/25/2018 09:23  Narrative EXAM: OVER-READ INTERPRETATION  CT CHEST  The following report is an over-read performed by radiologist Dr. Toribio Aye of [facility] on 12/22/2018. This over-read does not include interpretation of cardiac or coronary anatomy or pathology. The coronary calcium  score interpretation by the cardiologist is attached.  COMPARISON:  None.  FINDINGS: Within the visualized portions of the thorax there are no suspicious appearing pulmonary nodules or masses, there is no acute consolidative airspace disease, no pleural effusions, no pneumothorax and no lymphadenopathy. Visualized portions of the upper abdomen are unremarkable. There are no aggressive appearing lytic or blastic lesions noted in the visualized portions of the skeleton.  IMPRESSION: No significant incidental noncardiac findings are noted.  Electronically Signed: By: Toribio Aye M.D. On: 12/22/2018 10:15     ______________________________________________________________________________________________      Risk Assessment/Calculations  Physical Exam VS:  BP 102/67 (BP Location: Left  Arm, Patient Position: Sitting, Cuff Size: Normal)   Pulse 76   Resp 17   Ht 5' 2 (1.575 m)   Wt 140 lb (63.5 kg)   SpO2 98%   BMI 25.61 kg/m        Wt Readings from Last 3 Encounters:  09/10/24 140 lb (63.5 kg)  08/14/24 140 lb (63.5 kg)  02/17/24 148 lb 4.8 oz (67.3 kg)    GEN: Well nourished, well developed in no acute distress NECK: No JVD; No carotid bruits CARDIAC: RRR, no murmurs, rubs, gallops RESPIRATORY:  Clear to auscultation without rales, wheezing or rhonchi  ABDOMEN: Soft, non-tender, non-distended EXTREMITIES:  No edema; No deformity   ASSESSMENT AND PLAN  HLD, LDL goal <70 / Familial hyperlipidemia - PA recent approved for Repatha . Rx Repatha  140mg  q14 days. Lipid panel, liver function panel in 3 months. Continue Rousvastatin 20mg  daily.  Dizziness / Lightheadedness - 6 week history of lightheadedness and dizziness. Symptoms not consistent with orthostasis. Regular meals and hydration encouraged. Plan for echocardiogram to rule out valvular abnormality   Bilateral arm numbness - encouraged to discuss potential neck imaging with her neurologist and/or primary care provider. 2+ palpable radial pulses.        Dispo: follow up in 6 months  Signed, Reche GORMAN Finder, NP

## 2024-09-12 DIAGNOSIS — Z79899 Other long term (current) drug therapy: Secondary | ICD-10-CM | POA: Diagnosis not present

## 2024-09-12 DIAGNOSIS — G43719 Chronic migraine without aura, intractable, without status migrainosus: Secondary | ICD-10-CM | POA: Diagnosis not present

## 2024-09-14 ENCOUNTER — Telehealth: Payer: Self-pay | Admitting: Pharmacist Clinician (PhC)/ Clinical Pharmacy Specialist

## 2024-09-14 NOTE — Telephone Encounter (Signed)
3 month labs

## 2024-09-19 DIAGNOSIS — L299 Pruritus, unspecified: Secondary | ICD-10-CM | POA: Diagnosis not present

## 2024-09-19 DIAGNOSIS — Z853 Personal history of malignant neoplasm of breast: Secondary | ICD-10-CM | POA: Diagnosis not present

## 2024-09-19 DIAGNOSIS — G43909 Migraine, unspecified, not intractable, without status migrainosus: Secondary | ICD-10-CM | POA: Diagnosis not present

## 2024-09-24 ENCOUNTER — Ambulatory Visit (HOSPITAL_BASED_OUTPATIENT_CLINIC_OR_DEPARTMENT_OTHER): Admitting: Cardiology

## 2024-09-26 DIAGNOSIS — R42 Dizziness and giddiness: Secondary | ICD-10-CM | POA: Diagnosis not present

## 2024-09-26 DIAGNOSIS — G43909 Migraine, unspecified, not intractable, without status migrainosus: Secondary | ICD-10-CM | POA: Diagnosis not present

## 2024-09-26 DIAGNOSIS — Z853 Personal history of malignant neoplasm of breast: Secondary | ICD-10-CM | POA: Diagnosis not present

## 2024-09-27 DIAGNOSIS — Z08 Encounter for follow-up examination after completed treatment for malignant neoplasm: Secondary | ICD-10-CM | POA: Diagnosis not present

## 2024-09-27 DIAGNOSIS — L299 Pruritus, unspecified: Secondary | ICD-10-CM | POA: Diagnosis not present

## 2024-09-27 DIAGNOSIS — R17 Unspecified jaundice: Secondary | ICD-10-CM | POA: Diagnosis not present

## 2024-09-27 DIAGNOSIS — R232 Flushing: Secondary | ICD-10-CM | POA: Diagnosis not present

## 2024-09-27 DIAGNOSIS — Z853 Personal history of malignant neoplasm of breast: Secondary | ICD-10-CM | POA: Diagnosis not present

## 2024-10-01 DIAGNOSIS — R232 Flushing: Secondary | ICD-10-CM | POA: Diagnosis not present

## 2024-10-01 DIAGNOSIS — Z853 Personal history of malignant neoplasm of breast: Secondary | ICD-10-CM | POA: Diagnosis not present

## 2024-10-01 DIAGNOSIS — R17 Unspecified jaundice: Secondary | ICD-10-CM | POA: Diagnosis not present

## 2024-10-01 DIAGNOSIS — R918 Other nonspecific abnormal finding of lung field: Secondary | ICD-10-CM | POA: Diagnosis not present

## 2024-10-01 DIAGNOSIS — Z08 Encounter for follow-up examination after completed treatment for malignant neoplasm: Secondary | ICD-10-CM | POA: Diagnosis not present

## 2024-10-01 DIAGNOSIS — L299 Pruritus, unspecified: Secondary | ICD-10-CM | POA: Diagnosis not present

## 2024-10-08 ENCOUNTER — Other Ambulatory Visit (INDEPENDENT_AMBULATORY_CARE_PROVIDER_SITE_OTHER)

## 2024-10-08 ENCOUNTER — Ambulatory Visit (HOSPITAL_BASED_OUTPATIENT_CLINIC_OR_DEPARTMENT_OTHER): Payer: Self-pay | Admitting: Family

## 2024-10-08 DIAGNOSIS — R42 Dizziness and giddiness: Secondary | ICD-10-CM | POA: Diagnosis not present

## 2024-10-08 LAB — ECHOCARDIOGRAM COMPLETE
AV Vena cont: 0.36 cm
Area-P 1/2: 2 cm2
P 1/2 time: 644 ms
S' Lateral: 1.84 cm

## 2024-11-01 DIAGNOSIS — Z853 Personal history of malignant neoplasm of breast: Secondary | ICD-10-CM | POA: Diagnosis not present

## 2024-11-01 DIAGNOSIS — Z1231 Encounter for screening mammogram for malignant neoplasm of breast: Secondary | ICD-10-CM | POA: Diagnosis not present

## 2024-11-01 DIAGNOSIS — Z08 Encounter for follow-up examination after completed treatment for malignant neoplasm: Secondary | ICD-10-CM | POA: Diagnosis not present

## 2024-11-14 DIAGNOSIS — L578 Other skin changes due to chronic exposure to nonionizing radiation: Secondary | ICD-10-CM | POA: Diagnosis not present

## 2024-11-14 DIAGNOSIS — M810 Age-related osteoporosis without current pathological fracture: Secondary | ICD-10-CM | POA: Diagnosis not present

## 2024-11-14 DIAGNOSIS — L570 Actinic keratosis: Secondary | ICD-10-CM | POA: Diagnosis not present

## 2024-11-14 DIAGNOSIS — E785 Hyperlipidemia, unspecified: Secondary | ICD-10-CM | POA: Diagnosis not present

## 2024-11-14 DIAGNOSIS — L821 Other seborrheic keratosis: Secondary | ICD-10-CM | POA: Diagnosis not present

## 2024-11-14 DIAGNOSIS — N952 Postmenopausal atrophic vaginitis: Secondary | ICD-10-CM | POA: Diagnosis not present

## 2024-11-14 DIAGNOSIS — W908XXS Exposure to other nonionizing radiation, sequela: Secondary | ICD-10-CM | POA: Diagnosis not present

## 2024-11-14 DIAGNOSIS — J069 Acute upper respiratory infection, unspecified: Secondary | ICD-10-CM | POA: Diagnosis not present

## 2024-11-14 DIAGNOSIS — L820 Inflamed seborrheic keratosis: Secondary | ICD-10-CM | POA: Diagnosis not present

## 2024-11-14 DIAGNOSIS — Z85828 Personal history of other malignant neoplasm of skin: Secondary | ICD-10-CM | POA: Diagnosis not present

## 2024-11-14 DIAGNOSIS — F419 Anxiety disorder, unspecified: Secondary | ICD-10-CM | POA: Diagnosis not present

## 2024-11-14 DIAGNOSIS — Z853 Personal history of malignant neoplasm of breast: Secondary | ICD-10-CM | POA: Diagnosis not present

## 2024-11-14 DIAGNOSIS — Z0000 Encounter for general adult medical examination without abnormal findings: Secondary | ICD-10-CM | POA: Diagnosis not present

## 2024-11-14 DIAGNOSIS — L84 Corns and callosities: Secondary | ICD-10-CM | POA: Diagnosis not present

## 2024-11-14 DIAGNOSIS — F325 Major depressive disorder, single episode, in full remission: Secondary | ICD-10-CM | POA: Diagnosis not present

## 2024-11-14 DIAGNOSIS — G43009 Migraine without aura, not intractable, without status migrainosus: Secondary | ICD-10-CM | POA: Diagnosis not present

## 2024-11-14 DIAGNOSIS — Z08 Encounter for follow-up examination after completed treatment for malignant neoplasm: Secondary | ICD-10-CM | POA: Diagnosis not present

## 2024-11-20 DIAGNOSIS — G43709 Chronic migraine without aura, not intractable, without status migrainosus: Secondary | ICD-10-CM | POA: Diagnosis not present

## 2025-03-11 ENCOUNTER — Ambulatory Visit (HOSPITAL_BASED_OUTPATIENT_CLINIC_OR_DEPARTMENT_OTHER): Admitting: Cardiology
